# Patient Record
Sex: Male | Born: 1970 | Race: White | Hispanic: No | Marital: Married | Smoking: Former smoker
Health system: Southern US, Community
[De-identification: ages and names within clinical notes are randomized; demographics above are authoritative.]

## PROBLEM LIST (undated history)

## (undated) DIAGNOSIS — I10 Essential (primary) hypertension: Secondary | ICD-10-CM

## (undated) DIAGNOSIS — I350 Nonrheumatic aortic (valve) stenosis: Secondary | ICD-10-CM

## (undated) DIAGNOSIS — M1990 Unspecified osteoarthritis, unspecified site: Secondary | ICD-10-CM

## (undated) HISTORY — PX: CARDIAC SURGERY: SHX584

## (undated) HISTORY — PX: CHOLECYSTECTOMY: SHX55

## (undated) HISTORY — PX: SHOULDER SURGERY: SHX246

---
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
## 2001-10-08 ENCOUNTER — Ambulatory Visit (HOSPITAL_COMMUNITY): Admission: RE | Admit: 2001-10-08 | Discharge: 2001-10-08 | Payer: Self-pay | Admitting: Internal Medicine

## 2002-02-17 ENCOUNTER — Ambulatory Visit (HOSPITAL_COMMUNITY): Admission: RE | Admit: 2002-02-17 | Discharge: 2002-02-17 | Payer: Self-pay | Admitting: Internal Medicine

## 2002-05-08 ENCOUNTER — Encounter (INDEPENDENT_AMBULATORY_CARE_PROVIDER_SITE_OTHER): Payer: Self-pay | Admitting: Internal Medicine

## 2002-05-08 ENCOUNTER — Ambulatory Visit (HOSPITAL_COMMUNITY): Admission: RE | Admit: 2002-05-08 | Discharge: 2002-05-08 | Payer: Self-pay | Admitting: Internal Medicine

## 2002-06-23 ENCOUNTER — Ambulatory Visit (HOSPITAL_COMMUNITY): Admission: RE | Admit: 2002-06-23 | Discharge: 2002-06-23 | Payer: Self-pay | Admitting: Internal Medicine

## 2002-07-09 ENCOUNTER — Encounter (INDEPENDENT_AMBULATORY_CARE_PROVIDER_SITE_OTHER): Payer: Self-pay | Admitting: Internal Medicine

## 2002-07-09 ENCOUNTER — Ambulatory Visit (HOSPITAL_COMMUNITY): Admission: RE | Admit: 2002-07-09 | Discharge: 2002-07-09 | Payer: Self-pay | Admitting: Internal Medicine

## 2002-07-10 ENCOUNTER — Emergency Department (HOSPITAL_COMMUNITY): Admission: EM | Admit: 2002-07-10 | Discharge: 2002-07-10 | Payer: Self-pay | Admitting: Emergency Medicine

## 2002-07-17 ENCOUNTER — Observation Stay (HOSPITAL_COMMUNITY): Admission: RE | Admit: 2002-07-17 | Discharge: 2002-07-18 | Payer: Self-pay | Admitting: General Surgery

## 2003-06-19 ENCOUNTER — Emergency Department (HOSPITAL_COMMUNITY): Admission: EM | Admit: 2003-06-19 | Discharge: 2003-06-19 | Payer: Self-pay | Admitting: Emergency Medicine

## 2004-04-13 ENCOUNTER — Ambulatory Visit: Payer: Self-pay | Admitting: Family Medicine

## 2004-05-02 ENCOUNTER — Ambulatory Visit: Payer: Self-pay | Admitting: Family Medicine

## 2004-07-14 ENCOUNTER — Ambulatory Visit: Payer: Self-pay | Admitting: Family Medicine

## 2005-06-04 HISTORY — PX: SHOULDER ARTHROSCOPY: SHX128

## 2006-06-04 HISTORY — PX: CHOLECYSTECTOMY: SHX55

## 2007-02-24 ENCOUNTER — Ambulatory Visit (HOSPITAL_COMMUNITY): Admission: RE | Admit: 2007-02-24 | Discharge: 2007-02-24 | Payer: Self-pay | Admitting: Internal Medicine

## 2009-07-05 ENCOUNTER — Ambulatory Visit: Payer: Self-pay | Admitting: Cardiology

## 2010-10-20 NOTE — Op Note (Signed)
NAME:  Kenneth [name], Kenneth [name]                          ACCOUNT NO.:  [id number]   MEDICAL RECORD NO.:  [id number]                   PATIENT TYPE:  OBV   LOCATION:  A337                                 FACILITY:  [facility]   PHYSICIAN:  Dalia Heading, M.D.               DATE OF BIRTH:  09-04-1970   DATE OF PROCEDURE:  07/17/2002  DATE OF DISCHARGE:                                 OPERATIVE REPORT   PREOPERATIVE DIAGNOSIS:  Cholecystitis, cholelithiasis.   POSTOPERATIVE DIAGNOSIS:  Cholecystitis, cholelithiasis.   PROCEDURE:  Laparoscopic cholecystectomy.   SURGEON:  Dalia Heading, M.D.   ANESTHESIA:  General endotracheal   INDICATIONS:  The patient is a 40 year old white male who presents with  biliary colic secondary to cholecystitis and cholelithiasis.  The risks and  benefits of the procedure including bleeding, infection, hepatobiliary  injury, and the possibility of an open procedure were fully explained to the  patient, who gave informed consent.   DESCRIPTION OF PROCEDURE:  The patient was placed in the supine position.  After induction of general endotracheal anesthesia, the abdomen was prepped  and draped using the usual sterile technique with Betadine.  Surgical site  confirmation was performed.   A supraumbilical incision was made down to the fascia.  A Veress needle was  introduced into the abdominal cavity under direct visualization without  difficulty.  The patient was placed in reverse Trendelenburg position and an  additional 11-mm trocar was placed in the epigastric region and 5-mm trocars  were placed in the right upper quadrant and right flank regions.  The liver  was inspected and noted to be within normal limits.   The gallbladder was retracted superiorly and laterally.  The dissection was  begun around the infundibulum of the gallbladder.  The cystic duct was first  identified.  Its junction to the infundibulum fully identified.  Endoclips  were placed  proximally and distally on the cystic duct; and the cystic duct  was divided.  This was likewise done on the cystic artery.  The gallbladder  was then freed away from the gallbladder fossa using Bovie electrocautery.  The gallbladder was delivered through the epigastric trocar site without  difficulty.  The gallbladder fossa was inspected and no abnormal bleeding or  bile leakage was noted.  Surgicel was placed in the gallbladder fossa, the  subhepatic space, as well as the right hepatic gutter were irrigated with  normal saline.  All fluid and air were then evacuated from the abdominal  cavity prior to removal of the trocars.   All wounds were irrigated with normal saline.  All wounds were injected with  0.5% Sensorcaine.  The supraumbilical fascia was reapproximated using an #0  Vicryl interrupted suture. All skin incisions were closed using staples.  Betadine ointment and dry sterile dressings were applied.   All tape and needle counts correct at the end of the procedure.  The patient  was extubated in the operating room and went back to recovery room in awake  and stable condition.   COMPLICATIONS:  None.   SPECIMENS:  Gallbladder.   BLOOD LOSS:  Minimal.                                               Dalia Heading, M.D.    MAJ/MEDQ  D:  07/17/2002  T:  07/17/2002  Job:  [id number]   cc:   Lionel December, M.D.  [address]  [place]  [place] [address]  Fax: [phone]

## 2010-10-20 NOTE — Op Note (Signed)
   NAME:  Kenneth [name], Kenneth [name]                          ACCOUNT NO.:  [id number]   MEDICAL RECORD NO.:  [id number]                   PATIENT TYPE:  AMB   LOCATION:  DAY                                  FACILITY:  [facility]   PHYSICIAN:  Lionel December, M.D.                 DATE OF BIRTH:  Mar 04, 1971   DATE OF PROCEDURE:  06/23/2002  DATE OF DISCHARGE:                                 OPERATIVE REPORT   PROCEDURE:  Endoscopic retrograde cholangiopancreatography which could no be  completed.   INDICATIONS FOR PROCEDURE:  Romero is a 40 year old Caucasian male with  recurrent epigastric right upper quadrant pain. He had EUS at [facility] last  weekend noted to have tiny stones in his bile duct. He is therefore  returning for ERCP with endoscopic sphincterotomy. The procedure was  reviewed with the patient and informed consent was obtained.   PREOP MEDICATIONS:  The following medications were used for the procedure:  Cetacaine spray for pharyngeal topical anesthesia, Demerol 50 mg IV, Versed  12 mg IV in divided dose. He was also premedicated with Phenergan 12.5 mg  IV.   FINDINGS:  The procedure performed in radiology department. __________  fluoroscopy but it was not used. The patient's vital signs and O2  saturations were monitored during the procedure and remained stable. The  patient was placed in semiprone position and the therapeutic Olympus video  duodenoscope was passed via the oropharynx into the esophagus and stomach.  At this point, the patient turned his head to the other side and tried to  sit up. The endoscope was therefore withdrawn. The patient was given more  Versed. He just would not be still. I talked with our anesthesiologist, Dr.  Jayme Cloud. He felt that with conscious sedation on board it would not be a  good idea to give him anesthesia and it was decided to bring him back  another day.   FINAL DIAGNOSES:  1. Microlithiasis of the common bile duct.  2. Unsuccessful ERCP because  this patient could not tolerate conscious     sedation.   PLAN:  Will bring him back for ERCP under general anesthesia or propofol,  but he will be scheduled in OR.                                               Lionel December, M.D.    NR/MEDQ  D:  06/23/2002  T:  06/23/2002  Job:  [id number]   cc:   Colon Flattery  [address]  [place]  [place] [address]  Fax: [phone]

## 2010-10-20 NOTE — Op Note (Signed)
NAME:  Kenneth [name], Kenneth [name]                          ACCOUNT NO.:  [id number]   MEDICAL RECORD NO.:  [id number]                   PATIENT TYPE:  AMB   LOCATION:  DAY                                  FACILITY:  [facility]   PHYSICIAN:  Lionel December, M.D.                 DATE OF BIRTH:  03/24/1971   DATE OF PROCEDURE:  07/09/2002  DATE OF DISCHARGE:                                 OPERATIVE REPORT   PROCEDURE:  Endoscopic retrograde cholangiopancreatography with  sphincterotomy.   ENDOSCOPIST:  Lionel December, M.D.   INDICATIONS:  The patient is a 40 year old Caucasian male with recurrent  epigastric pain whose extensive workup was negative other than findings of  peptic ulcer disease, but he did not get better with therapy.  He had  endoscopic ultrasound at [facility] recently, which showed tiny stones in the  bile duct.  It is therefore felt that he may be having biliary colic.  He is  therefore undergoing this intervention.  The procedure is reviewed with the  patient and informed consent is obtained.  The patient was here two weeks  ago and could not be well sedated with conscious sedation.  He is therefore  returning to have this examined under anesthesia.  Procedure risks were  explained to the patient, and informed consent was obtained.   PREOPERATIVE MEDICATIONS:  He was given __________ 0.5 mg IV during the  procedure, and he was given general endotracheal anesthesia for the  procedure.   DESCRIPTION OF PROCEDURE:  The procedure was performed in the OR.  The  patient's vital signs and O2 saturations were monitored during the procedure  and remained stable.  He was placed in almost semi-prone position.  Therapeutic Olympus video endoscope was passed through the lateral pharynx  and esophagus, stomach, and across the pylorus into the bulb and descending  duodenum.  The ampulla of Vater was identified.  There was a small very  orifice which was inconspicuous.  Intramural segment was very  short.  Cannulation was attempted with __________ and a Jagwire.  The pancreatic  duct was initially cannulated and gently filled to the entire length.  There  were no filling defects.  The bile duct was then selectively cannulated.  There were no obvious filling defects.  The cystic duct was patent.  Leaving  the Jagwire in place, a sphincterotomy was performed.  It was a short  sphincterotomy, but there was good flow of contrast and bile duct in the  duodenum.  By the time the procedure was completed, all of the contrast had  drained into the duodenum.  The endoscope was withdrawn.  The patient  tolerated the procedure well.   FINAL DIAGNOSES:  1. Tiny ampulla with short intramural segment.  2. Normal pancreatogram.  3. Normal cholangiogram.  4. Sphincterotomy performed with excellent drainage.    PLAN:  The patient will be monitored in the  hospital prior to going home  later today.  We will plan to see him in the office in four weeks.  Need for  a cholecystectomy will be discussed with the patient on that visit.                                               Lionel December, M.D.    NR/MEDQ  D:  07/09/2002  T:  07/09/2002  Job:  [id number]   cc:   Colon Flattery  [address]  [place]  [place] [address]  Fax: [phone]

## 2010-10-20 NOTE — H&P (Signed)
NAME:  Kenneth [name], Kenneth [name] NO.:  [id number]   MEDICAL RECORD NO.:  [id number]                   PATIENT TYPE:   LOCATION:                                       FACILITY:   PHYSICIAN:  Dalia Heading, M.D.               DATE OF BIRTH:  [birth year]   DATE OF ADMISSION:  07/17/2002  DATE OF DISCHARGE:                                HISTORY & PHYSICAL   CHIEF COMPLAINT:  Cholecystitis and cholelithiasis.   HISTORY OF PRESENT ILLNESS:  The patient is a 40 year old white male who  recently underwent an ERCP with sphincterotomy on July 09, 2002, by  Lionel December, M.D., and was found to have microlithiasis.  He did have a  sphincterotomy at the time of his ERCP.  He is now referred for a  cholecystectomy.  He has been having intermittent episodes of right upper  quadrant abdominal pain with radiation to the right flank and back, nausea,  indigestion, and bloating for some time now.  No jaundice has been noted.  The symptoms seem to be worsening.   PAST MEDICAL HISTORY:  For the most part unremarkable.  Denies any bleeding  disorders.   PAST SURGICAL HISTORY:  As noted above and aortic valvuloplasty as a child.   CURRENT MEDICATIONS:  1. Hydrocodone.  2. An antibiotic for a cold, which he is finishing.   SOCIAL HISTORY:  He does smoke a packs of cigarettes a day.  He denies any  significant alcohol use.   PHYSICAL EXAMINATION:  GENERAL APPEARANCE:  The patient is a well-developed,  well-nourished, white male in no acute distress.  VITAL SIGNS:  He is afebrile and vital signs are stable.  HEENT:  No scleral icterus.  LUNGS:  Clear to auscultation with equal breath sounds bilaterally.  HEART:  Regular rate and rhythm without S3, S4, or murmurs.  ABDOMEN:  Soft with slight tenderness in the right upper quadrant to  palpation.  No hepatosplenomegaly, masses, or hernias are identified.   IMPRESSION:  1. Cholecystitis.  2. Cholelithiasis.    PLAN:   The patient is scheduled for laparoscopic cholecystectomy on July 17, 2002.  The risks and benefits of the procedure, including bleeding,  infection, hepatobiliary injury, and the possibility of an open procedure  were fully explained to the patient, who gave informed consent.                                               Dalia Heading, M.D.    MAJ/MEDQ  D:  07/16/2002  T:  07/16/2002  Job:  [id number]   cc:   Lionel December, M.D.  [address]  [place]  [place] [address]  Fax: [phone]   Kenneth [name]  [address]  [place]  [place] [address]  Fax: [phone]

## 2010-10-20 NOTE — Op Note (Signed)
NAME:  Kenneth [name], Kenneth [name]                          ACCOUNT NO.:  [id number]   MEDICAL RECORD NO.:  [id number]                   PATIENT TYPE:  AMB   LOCATION:  DAY                                  FACILITY:  [facility]   PHYSICIAN:  Lionel December, M.D.                 DATE OF BIRTH:  05-05-1971   DATE OF PROCEDURE:  02/17/2002  DATE OF DISCHARGE:                                 OPERATIVE REPORT   PROCEDURE:  Esophagogastroduodenoscopy.   ENDOSCOPIST:  Lionel December, M.D.   INDICATIONS:  This patient is a 40 year old Caucasian male with recurrent  upper abdominal pain.  Initially he presented in February of this year and  discharged with the diagnosis of pancreatitis.  However, this could not be  confirmed on a CT.  His pain did not respond to acid suppression.  His H.  pylori was negative.  He had an EGD in May this year which showed erosive  antral gastritis and 2 bulbar ulcers as well as changes of duodenitis.  I  was concerned about Crohn's disease, but no granulomas were seen on the  biopsy.  He was treated with acid suppression and given Prevpac in spite of  negative H. pylori given endoscopic findings.  He was seen in the office  about 10 weeks ago and he was doing great.  The pain started about a week  ago and has been getting gradually worse.  He is undergoing repeat EGD to  find out if his peptic ulcer disease is back or if he has a gastroduodenal  or Crohn's disease.  The procedure and risks were reviewed with the patient  and informed consent was obtained.  He has a history of aortic stenosis and  therefore received SB prophylaxis with ampicillin and gentamicin.   PREOPERATIVE MEDICATIONS:  Cetacaine spray for pharyngeal topical  anesthesia, Demerol 75 mg IV and Versed 12 mg IV in divided dose.   INSTRUMENT:  Olympus video system.   FINDINGS:  Procedure performed in endoscopy suite.  The patient's vital  signs and O2 saturation were monitored during the procedure and  remained  stable.  The patient was placed in the left lateral recumbent position and  endoscope was passed via the oropharynx without any difficulty into the  esophagus.   ESOPHAGUS:  Mucosa of the esophagus was normal throughout.  Squamocolumnar  junction was unremarkable.   STOMACH:  It was empty and distended very well with insufflation.  The  mucosa was normal except at the antrum there was intense prepyloric erythema  with a few erosions, but no ulcer was noted.  Pyloric channel was patent.  Angularis and fundus were examined by retroflexing the scope and were  normal.   DUODENUM:  Examination of the bulb revealed multiple patches of erythematous  mucosa with erosions and a small ulcer towards the anteromedial wall.  The  scope was passed to the second part  of the duodenum where the mucosa and  folds were normal.  Multiple pictures were taken.  Biopsies were also taken  from the duodenal ulcer and surrounding area.   The endoscope was withdrawn.  The patient tolerated the procedure well.    FINAL DIAGNOSES:  1. Small bulbar ulcer with previous changes of duodenitis.  2. Antral gastritis.  Biopsy taken from the duodenal bulb.   RECOMMENDATIONS:  1. We will start him back on Nexium at 40 mg p.o. q.a.m.  2. H. pylori serology will be rechecked today along with serum amylase and     lipase.  He will continue to use Vicodin on a p.r.n. basis.  Prescription     was called in for 30 on 02/14/02.  3. I will be contacting the patient with biopsy results and further     recommendations.                                               Lionel December, M.D.    NR/MEDQ  D:  02/17/2002  T:  02/18/2002  Job:  [id number]   cc:   Colon Flattery  [address]  [place]  [place] [address]  Fax: [phone]

## 2013-09-27 ENCOUNTER — Emergency Department (HOSPITAL_COMMUNITY)
Admission: EM | Admit: 2013-09-27 | Discharge: 2013-09-27 | Disposition: A | Discharge status: Home / Self Care | Payer: PRIVATE HEALTH INSURANCE | Attending: Emergency Medicine | Admitting: Emergency Medicine

## 2013-09-27 ENCOUNTER — Emergency Department (HOSPITAL_COMMUNITY): Payer: PRIVATE HEALTH INSURANCE

## 2013-09-27 ENCOUNTER — Encounter (HOSPITAL_COMMUNITY): Payer: Self-pay | Admitting: Emergency Medicine

## 2013-09-27 DIAGNOSIS — S60219A Contusion of unspecified wrist, initial encounter: Secondary | ICD-10-CM | POA: Insufficient documentation

## 2013-09-27 DIAGNOSIS — Y9301 Activity, walking, marching and hiking: Secondary | ICD-10-CM | POA: Insufficient documentation

## 2013-09-27 DIAGNOSIS — S60211A Contusion of right wrist, initial encounter: Secondary | ICD-10-CM

## 2013-09-27 DIAGNOSIS — R296 Repeated falls: Secondary | ICD-10-CM | POA: Insufficient documentation

## 2013-09-27 DIAGNOSIS — Z87891 Personal history of nicotine dependence: Secondary | ICD-10-CM | POA: Insufficient documentation

## 2013-09-27 DIAGNOSIS — I10 Essential (primary) hypertension: Secondary | ICD-10-CM | POA: Insufficient documentation

## 2013-09-27 DIAGNOSIS — Y9289 Other specified places as the place of occurrence of the external cause: Secondary | ICD-10-CM | POA: Insufficient documentation

## 2013-09-27 HISTORY — DX: Essential (primary) hypertension: I10

## 2013-09-27 HISTORY — DX: Nonrheumatic aortic (valve) stenosis: I35.0

## 2013-09-27 MED ORDER — IBUPROFEN 800 MG PO TABS
800.0000 mg | ORAL_TABLET | Freq: Once | ORAL | Status: AC
  Administered 2013-09-27: 800 mg via ORAL
  Filled 2013-09-27: qty 1

## 2013-09-27 MED ORDER — HYDROCODONE-ACETAMINOPHEN 5-325 MG PO TABS: 2.0000 | ORAL_TABLET | ORAL | Status: DC | PRN

## 2013-09-27 MED ORDER — HYDROCODONE-ACETAMINOPHEN 5-325 MG PO TABS: 1.0000 | ORAL_TABLET | ORAL | Status: DC | PRN

## 2013-09-27 MED ORDER — ACETAMINOPHEN 500 MG PO TABS
1000.0000 mg | ORAL_TABLET | Freq: Once | ORAL | Status: AC
  Administered 2013-09-27: 1000 mg via ORAL
  Filled 2013-09-27: qty 2

## 2013-09-27 MED ORDER — DICLOFENAC SODIUM 75 MG PO TBEC: 75.0000 mg | DELAYED_RELEASE_TABLET | Freq: Two times a day (BID) | ORAL | Status: DC

## 2013-09-27 NOTE — ED Notes (Signed)
Pt reports walking on the river bank which went out from under him. Pt fell catching himself on right wrist on a rock. No lacerations noted. C/o pain to ulnar aspect of wrist and tingling into 5th finger. Pt has sensation and positive pulse in extremity.

## 2013-09-27 NOTE — Discharge Instructions (Signed)
Please leave the splint in place over the next 5 days. Please apply ice to your wrist. Please use the sling. Use diclofenac 2 times daily with food. May use Norco for pain if needed. Norco may cause drowsiness, please use with caution. Please see Dr. Hilda LiasKeeling, or the orthopedic specialist of your choice if pain is not improving when you take the splint off in 5 days. Contusion A contusion is a deep bruise. Contusions are the result of an injury that caused bleeding under the skin. The contusion may turn blue, purple, or yellow. Minor injuries will give you a painless contusion, but more severe contusions may stay painful and swollen for a few weeks.  CAUSES  A contusion is usually caused by a blow, trauma, or direct force to an area of the body. SYMPTOMS   Swelling and redness of the injured area.  Bruising of the injured area.  Tenderness and soreness of the injured area.  Pain. DIAGNOSIS  The diagnosis can be made by taking a history and physical exam. An X-ray, CT scan, or MRI may be needed to determine if there were any associated injuries, such as fractures. TREATMENT  Specific treatment will depend on what area of the body was injured. In general, the best treatment for a contusion is resting, icing, elevating, and applying cold compresses to the injured area. Over-the-counter medicines may also be recommended for pain control. Ask your caregiver what the best treatment is for your contusion. HOME CARE INSTRUCTIONS   Put ice on the injured area.  Put ice in a plastic bag.  Place a towel between your skin and the bag.  Leave the ice on for 15-20 minutes, 03-04 times a day.  Only take over-the-counter or prescription medicines for pain, discomfort, or fever as directed by your caregiver. Your caregiver may recommend avoiding anti-inflammatory medicines (aspirin, ibuprofen, and naproxen) for 48 hours because these medicines may increase bruising.  Rest the injured area.  If  possible, elevate the injured area to reduce swelling. SEEK IMMEDIATE MEDICAL CARE IF:   You have increased bruising or swelling.  You have pain that is getting worse.  Your swelling or pain is not relieved with medicines. MAKE SURE YOU:   Understand these instructions.  Will watch your condition.  Will get help right away if you are not doing well or get worse. Document Released: 02/28/2005 Document Revised: 08/13/2011 Document Reviewed: 03/26/2011 [facility] Patient Information 2014 [place], [place].

## 2013-09-27 NOTE — ED Provider Notes (Signed)
CSN: [id number]     Arrival date & time 09/27/13  1935 History   First MD Initiated Contact with Patient 09/27/13 2017     Chief Complaint  Patient presents with  . Wrist Pain     (Consider location/radiation/quality/duration/timing/severity/associated sxs/prior Treatment) Patient is a 43 y.o. male presenting with wrist pain. The history is provided by the patient.  Wrist Pain This is a new problem. The current episode started today. The problem occurs constantly. The problem has been gradually worsening. Associated symptoms include numbness. Pertinent negatives include no abdominal pain, arthralgias, chest pain, coughing, neck pain or weakness. He has tried nothing for the symptoms. The treatment provided no relief.    Past Medical History  Diagnosis Date  . Hypertension   . Aortic stenosis    Past Surgical History  Procedure Laterality Date  . Cardiac surgery    . Shoulder surgery    . Cholecystectomy     No family history on file. History  Substance Use Topics  . Smoking status: Former Games developermoker  . Smokeless tobacco: Not on file  . Alcohol Use: No    Review of Systems  Constitutional: Negative for activity change.       All ROS Neg except as noted in HPI  HENT: Negative for nosebleeds.   Eyes: Negative for photophobia and discharge.  Respiratory: Negative for cough, shortness of breath and wheezing.   Cardiovascular: Negative for chest pain and palpitations.  Gastrointestinal: Negative for abdominal pain and blood in stool.  Genitourinary: Negative for dysuria, frequency and hematuria.  Musculoskeletal: Negative for arthralgias, back pain and neck pain.  Skin: Negative.   Neurological: Positive for numbness. Negative for dizziness, seizures, speech difficulty and weakness.  Psychiatric/Behavioral: Negative for hallucinations and confusion.      Allergies  Review of patient's allergies indicates no known allergies.  Home Medications   Prior to Admission  medications   Not on File   BP 133/81  Pulse 92  Temp(Src) 98 F (36.7 C) (Oral)  Resp 20  Ht 5\' 7"  (1.702 m)  Wt 200 lb (90.719 kg)  BMI 31.32 kg/m2  SpO2 98% Physical Exam  Nursing note and vitals reviewed. Constitutional: He is oriented to person, place, and time. He appears well-developed and well-nourished.  Non-toxic appearance.  HENT:  Head: Normocephalic.  Right Ear: Tympanic membrane and external ear normal.  Left Ear: Tympanic membrane and external ear normal.  Eyes: EOM and lids are normal. Pupils are equal, round, and reactive to light.  Neck: Normal range of motion. Neck supple. Carotid bruit is not present.  Cardiovascular: Normal rate, regular rhythm, normal heart sounds, intact distal pulses and normal pulses.   Pulmonary/Chest: Breath sounds normal. No respiratory distress.  Abdominal: Soft. Bowel sounds are normal. There is no tenderness. There is no guarding.  Musculoskeletal:       Right wrist: He exhibits decreased range of motion, tenderness and bony tenderness. He exhibits no effusion and no deformity.  Pain with movement of the right 5th finger. No noted deformity.  Lymphadenopathy:       Head (right side): No submandibular adenopathy present.       Head (left side): No submandibular adenopathy present.    He has no cervical adenopathy.  Neurological: He is alert and oriented to person, place, and time. He has normal strength. No cranial nerve deficit or sensory deficit.  Skin: Skin is warm and dry.  Psychiatric: He has a normal mood and affect. His speech is normal.  ED Course  Procedures (including critical care time) Labs Review Labs Reviewed - No data to display  Imaging Review Dg Wrist Complete Right  09/27/2013   CLINICAL DATA:  Status post fall. Right wrist pain on the ulnar side.  EXAM: RIGHT WRIST - COMPLETE 3+ VIEW  COMPARISON:  None.  FINDINGS: Imaged bones, joints and soft tissues appear normal.  IMPRESSION: Negative exam.    Electronically Signed   By: Drusilla Kannerhomas  Dalessio M.D.   On: 09/27/2013 20:19     EKG Interpretation None      MDM Xray of the right wrist is negative for fx. Pt extremely tender to palpation and movement of the wrist and the right fifth finger.  Pt to be placed in an ulnar gutter splint and sling. Rx for diclofenac and norco given to the pt. He will follow up with Dr Hilda LiasKeeling in 5 to 7 day if pain continues.   Final diagnoses:  None    *I have reviewed nursing notes, vital signs, and all appropriate lab and imaging results for this patient.Kathie Dike**    Dlisa Barnwell M Rosanne Wohlfarth, PA-C 09/28/13 0021

## 2013-09-28 MED FILL — Hydrocodone-Acetaminophen Tab 5-325 MG: ORAL | Qty: 6 | Status: AC

## 2013-09-28 NOTE — ED Provider Notes (Signed)
Medical screening examination/treatment/procedure(s) were performed by non-physician practitioner and as supervising physician I was immediately available for consultation/collaboration.   EKG Interpretation None        Kenneth LennertJoseph L Darron Stuck, MD 09/28/13 [phone]

## 2016-07-30 ENCOUNTER — Other Ambulatory Visit: Payer: Self-pay | Admitting: Physician Assistant

## 2016-07-30 NOTE — Telephone Encounter (Signed)
[facility]  ??

## 2016-09-12 ENCOUNTER — Ambulatory Visit: Payer: Self-pay | Admitting: Physician Assistant

## 2016-09-13 ENCOUNTER — Encounter: Payer: Self-pay | Admitting: Physician Assistant

## 2016-10-02 ENCOUNTER — Other Ambulatory Visit: Payer: Self-pay | Admitting: Physician Assistant

## 2016-10-02 NOTE — Telephone Encounter (Signed)
No visit at [facility] yet - jones pt?

## 2016-10-05 ENCOUNTER — Other Ambulatory Visit: Payer: Self-pay | Admitting: Physician Assistant

## 2016-10-05 NOTE — Telephone Encounter (Signed)
Hasn't been seen here in this office?

## 2017-04-24 ENCOUNTER — Encounter (HOSPITAL_COMMUNITY): Payer: Self-pay | Admitting: *Deleted

## 2017-04-24 ENCOUNTER — Emergency Department (HOSPITAL_COMMUNITY)
Admission: EM | Admit: 2017-04-24 | Discharge: 2017-04-24 | Disposition: A | Discharge status: Home / Self Care | Payer: PRIVATE HEALTH INSURANCE | Attending: Emergency Medicine | Admitting: Emergency Medicine

## 2017-04-24 ENCOUNTER — Other Ambulatory Visit: Payer: Self-pay

## 2017-04-24 DIAGNOSIS — R2242 Localized swelling, mass and lump, left lower limb: Secondary | ICD-10-CM | POA: Diagnosis present

## 2017-04-24 DIAGNOSIS — I10 Essential (primary) hypertension: Secondary | ICD-10-CM | POA: Insufficient documentation

## 2017-04-24 DIAGNOSIS — Z87891 Personal history of nicotine dependence: Secondary | ICD-10-CM | POA: Diagnosis not present

## 2017-04-24 DIAGNOSIS — L02416 Cutaneous abscess of left lower limb: Secondary | ICD-10-CM | POA: Diagnosis not present

## 2017-04-24 DIAGNOSIS — Z79899 Other long term (current) drug therapy: Secondary | ICD-10-CM | POA: Insufficient documentation

## 2017-04-24 DIAGNOSIS — Z9049 Acquired absence of other specified parts of digestive tract: Secondary | ICD-10-CM | POA: Insufficient documentation

## 2017-04-24 MED ORDER — KETOROLAC TROMETHAMINE 30 MG/ML IJ SOLN
60.0000 mg | Freq: Once | INTRAMUSCULAR | Status: AC
  Administered 2017-04-24: 60 mg via INTRAMUSCULAR
  Filled 2017-04-24: qty 2

## 2017-04-24 MED ORDER — HYDROCODONE-ACETAMINOPHEN 5-325 MG PO TABS: 1.0000 | ORAL_TABLET | Freq: Four times a day (QID) | ORAL | 0 refills | Status: DC | PRN

## 2017-04-24 MED ORDER — LIDOCAINE-EPINEPHRINE (PF) 2 %-1:200000 IJ SOLN
INTRAMUSCULAR | Status: AC
  Filled 2017-04-24: qty 20

## 2017-04-24 MED ORDER — CLINDAMYCIN HCL 300 MG PO CAPS: 300.0000 mg | ORAL_CAPSULE | Freq: Four times a day (QID) | ORAL | 0 refills | Status: DC

## 2017-04-24 NOTE — Discharge Instructions (Signed)
Continue to soak in a tub of warm epsom salts for 30 minutes 3 times a day. Take the clindamycin antibiotic 4 times a day for 10 days, I gave you extra incase you don't have enough to do that. Return to the ED if you get a fever, vomiting, or the pain and swelling and redness aren't improving over the next 24-48 hrs. You can remove the packing while in the tub in 2 days, just pull it out quickly with your leg under water. It is less than an inch long. Take the hydrocodone for pain with ibuprofen 600 mg 4 times a day for pain.

## 2017-04-24 NOTE — ED Provider Notes (Signed)
[facility]GENCY [facility]PARTMENT Provider Note   CSN: [id number] Arrival date & time: 04/24/17  0515  Time seen 06:15 AM   History   Chief Complaint Chief Complaint  Patient presents with  . Abscess    HPI Kenneth [name] is a 46 y.o. male.  HPI patient reports he started having nasal congestion and facial fullness on November 17.  He was seen by his PCP on November 19 and diagnosed with a sinus infection and was started on clindamycin.  He took 1 dose that day.  The following day he started having some pain in his left posterior thigh and had some redness in the area.  He thought it looked like a bite area.  He went to urgent care and he was diagnosed with an abscess.  He was given an injection of Rocephin.  He states the area is getting more red and more painful.  Its he states it woke him up this morning.  Patient states he had an abscess before on his right lower leg that was MRSA.  He was hospitalized for about 3 days.  That was about 10 years ago.  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  Past Medical History:  Diagnosis Date  . Aortic stenosis   . Aortic stenosis   . Hypertension     There are no active problems to display for this patient.   Past Surgical History:  Procedure Laterality Date  . CARDIAC SURGERY    . CHOLECYSTECTOMY    . SHOULDER SURGERY         Home Medications    Clindamycin 300 mg TID   Prior to Admission medications   Medication Sig Start Date End Date Taking? Authorizing Provider  clindamycin (CLEOCIN) 300 MG capsule Take 1 capsule (300 mg total) by mouth 4 (four) times daily. 04/24/17   Devoria AlbeKnapp, Auriana Scalia, MD  cyclobenzaprine (FLEXERIL) 10 MG tablet Take 1 Tablet by mouth 2 times a day AS NEEDED FOR MUSCLE SPASMS 10/02/16   Remus LofflerJones, Angel S, PA-C  diclofenac (VOLTAREN) 75 MG EC tablet Take 1 tablet (75 mg total) by mouth 2 (two) times daily. 09/27/13   Ivery QualeBryant, Hobson, PA-C  enalapril (VASOTEC) 5 MG  tablet Take 1 Tablet by mouth once daily 10/05/16   Remus LofflerJones, Angel S, PA-C  HYDROcodone-acetaminophen (NORCO/VICODIN) 5-325 MG tablet Take 1 tablet by mouth every 6 (six) hours as needed. 04/24/17   Devoria AlbeKnapp, Mulki Roesler, MD    Family History No family history on file.  Social History Social History   Tobacco Use  . Smoking status: Former Games developermoker  . Smokeless tobacco: Never Used  Substance Use Topics  . Alcohol use: No  . Drug use: No  employed   Allergies   Patient has no known allergies.   Review of Systems Review of Systems  All other systems reviewed and are negative.    Physical Exam Updated Vital Signs BP (!) 144/95   Pulse 75   Temp 98.1 F (36.7 C)   Resp 18   Ht 5\' 6"  (1.676 m)   Wt 102.1 kg (225 lb)   SpO2 98%   BMI 36.32 kg/m   Vital signs normal    Physical Exam  Constitutional: He is oriented to person, place, and time. He appears well-developed and well-nourished. He appears distressed.  HENT:  Head: Normocephalic and atraumatic.  Right Ear: External ear normal.  Left Ear: External ear normal.  Nose: Nose normal.  Eyes: Conjunctivae and EOM are normal.  Neck: Normal range of motion.  Cardiovascular: Normal rate.  Pulmonary/Chest: Effort normal. No respiratory distress.  Musculoskeletal: Normal range of motion. He exhibits edema and tenderness.  Neurological: He is alert and oriented to person, place, and time. No cranial nerve deficit.  Skin: Skin is warm and dry. There is erythema.  Patient has a 6 cm area of redness and swelling with induration in the center on his left posterior medial proximal thigh.  There is a small area in the center that is consistent with a bite mark.  The area is very painful to palpation with induration.  Nursing note and vitals reviewed.    ED Treatments / Results  Labs (all labs ordered are listed, but only abnormal results are displayed) Labs Reviewed - No data to display  EKG  EKG Interpretation None        Radiology No results found.  Procedures .Marland KitchenIncision and Drainage Date/Time: 04/24/2017 6:37 AM Performed by: Devoria Albe, MD Authorized by: Devoria Albe, MD   Consent:    Consent obtained:  Verbal   Consent given by:  Patient   Risks discussed:  Infection   Alternatives discussed:  No treatment Location:    Type:  Abscess   Size:  6   Location:  Lower extremity   Lower extremity location:  Leg   Leg location:  L upper leg Pre-procedure details:    Skin preparation:  Betadine Anesthesia (see MAR for exact dosages):    Anesthesia method:  Local infiltration   Local anesthetic:  Lidocaine 2% WITH epi Procedure type:    Complexity:  Simple Procedure details:    Needle aspiration: no     Incision types:  Single straight   Incision depth:  Subcutaneous   Scalpel blade:  11   Wound management:  Probed and deloculated   Drainage:  Bloody and purulent   Drainage amount:  Scant   Packing materials:  1/2 in gauze   Amount 1/2":  2 Post-procedure details:    Patient tolerance of procedure:  Tolerated well, no immediate complications   (including critical care time)  Medications Ordered in ED Medications  lidocaine-EPINEPHrine (XYLOCAINE W/EPI) 2 %-1:200000 (PF) injection (not administered)  ketorolac (TORADOL) 30 MG/ML injection 60 mg (not administered)     Initial Impression / Assessment and Plan / ED Course  I have reviewed the triage vital signs and the nursing notes.  Pertinent labs & imaging results that were available during my care of the patient were reviewed by me and considered in my medical decision making (see chart for details).     Patient was given Toradol IM for pain. He has to drive himself home.  Patient is already on an antibiotic that should cover this infection.  He has only had however 4 doses of the antibiotics since it started.  The antibiotic was written for 3 times a day, I increased it to 4 times a day and gave him extra pills to make sure he  does not run out.  He needs to continue the antibiotic, he can continue doing the warm soaks with Epsom salts as he has been doing.  He was given Norco prepack for pain.  Review of the [place] shows no entries for the past year.  Final Clinical Impressions(s) / ED Diagnoses   Final diagnoses:  Abscess of left lower leg    ED Discharge Orders        Ordered  HYDROcodone-acetaminophen (NORCO/VICODIN) 5-325 MG tablet  Every 6 hours PRN     04/24/17 0648    clindamycin (CLEOCIN) 300 MG capsule  4 times daily     04/24/17 0651    OTC ibuprofen   Plan discharge  Devoria AlbeIva Zanaria Morell, MD, Concha PyoFACEP     Kaven Cumbie, MD 04/24/17 [phone]

## 2017-04-24 NOTE — ED Triage Notes (Signed)
Pt c/o abscess to back of left thigh area, was treated at urgent care yesterday, started on clindamycin, started running fever during the night,

## 2017-04-26 ENCOUNTER — Emergency Department (HOSPITAL_COMMUNITY)
Admission: EM | Admit: 2017-04-26 | Discharge: 2017-04-26 | Disposition: A | Discharge status: Home / Self Care | Payer: PRIVATE HEALTH INSURANCE | Attending: Emergency Medicine | Admitting: Emergency Medicine

## 2017-04-26 ENCOUNTER — Other Ambulatory Visit: Payer: Self-pay

## 2017-04-26 ENCOUNTER — Encounter (HOSPITAL_COMMUNITY): Payer: Self-pay | Admitting: Emergency Medicine

## 2017-04-26 DIAGNOSIS — Z79899 Other long term (current) drug therapy: Secondary | ICD-10-CM | POA: Diagnosis not present

## 2017-04-26 DIAGNOSIS — Z9049 Acquired absence of other specified parts of digestive tract: Secondary | ICD-10-CM | POA: Diagnosis not present

## 2017-04-26 DIAGNOSIS — I10 Essential (primary) hypertension: Secondary | ICD-10-CM | POA: Insufficient documentation

## 2017-04-26 DIAGNOSIS — Z87891 Personal history of nicotine dependence: Secondary | ICD-10-CM | POA: Diagnosis not present

## 2017-04-26 DIAGNOSIS — L03116 Cellulitis of left lower limb: Secondary | ICD-10-CM | POA: Diagnosis present

## 2017-04-26 DIAGNOSIS — L02416 Cutaneous abscess of left lower limb: Secondary | ICD-10-CM | POA: Insufficient documentation

## 2017-04-26 DIAGNOSIS — R2242 Localized swelling, mass and lump, left lower limb: Secondary | ICD-10-CM | POA: Diagnosis present

## 2017-04-26 LAB — CBC WITH DIFFERENTIAL/PLATELET
BASOS ABS: 0 10*3/uL (ref 0.0–0.1)
Basophils Relative: 1 %
EOS PCT: 4 %
Eosinophils Absolute: 0.3 10*3/uL (ref 0.0–0.7)
HCT: 45.7 % (ref 39.0–52.0)
Hemoglobin: 15.3 g/dL (ref 13.0–17.0)
Lymphocytes Relative: 30 %
Lymphs Abs: 2.5 10*3/uL (ref 0.7–4.0)
MCH: 30.1 pg (ref 26.0–34.0)
MCHC: 33.5 g/dL (ref 30.0–36.0)
MCV: 89.8 fL (ref 78.0–100.0)
Monocytes Absolute: 0.5 10*3/uL (ref 0.1–1.0)
Monocytes Relative: 6 %
NEUTROS ABS: 5.1 10*3/uL (ref 1.7–7.7)
Neutrophils Relative %: 59 %
Platelets: 271 10*3/uL (ref 150–400)
RBC: 5.09 MIL/uL (ref 4.22–5.81)
RDW: 12.4 % (ref 11.5–15.5)
WBC: 8.5 10*3/uL (ref 4.0–10.5)

## 2017-04-26 LAB — BASIC METABOLIC PANEL
ANION GAP: 9 (ref 5–15)
BUN: 11 mg/dL (ref 6–20)
CO2: 28 mmol/L (ref 22–32)
Calcium: 10.5 mg/dL — ABNORMAL HIGH (ref 8.9–10.3)
Chloride: 101 mmol/L (ref 101–111)
Creatinine, Ser: 1.11 mg/dL (ref 0.61–1.24)
GFR calc non Af Amer: >60 mL/min (ref >60)
GLUCOSE: 92 mg/dL (ref 65–99)
Potassium: 3.9 mmol/L (ref 3.5–5.1)
Sodium: 138 mmol/L (ref 135–145)

## 2017-04-26 MED ORDER — VANCOMYCIN HCL IN DEXTROSE 1-5 GM/200ML-% IV SOLN
1000.0000 mg | Freq: Once | INTRAVENOUS | Status: AC
  Administered 2017-04-26: 1000 mg via INTRAVENOUS
  Filled 2017-04-26: qty 200

## 2017-04-26 MED ORDER — MORPHINE SULFATE (PF) 4 MG/ML IV SOLN
4.0000 mg | Freq: Once | INTRAVENOUS | Status: AC
  Administered 2017-04-26: 4 mg via INTRAVENOUS
  Filled 2017-04-26: qty 1

## 2017-04-26 MED ORDER — ONDANSETRON HCL 4 MG/2ML IJ SOLN
4.0000 mg | Freq: Once | INTRAMUSCULAR | Status: AC
  Administered 2017-04-26: 4 mg via INTRAVENOUS
  Filled 2017-04-26: qty 2

## 2017-04-26 MED ORDER — CEPHALEXIN 500 MG PO CAPS: 500.0000 mg | ORAL_CAPSULE | Freq: Three times a day (TID) | ORAL | 0 refills | Status: AC

## 2017-04-26 MED ORDER — SULFAMETHOXAZOLE-TRIMETHOPRIM 800-160 MG PO TABS: 2.0000 | ORAL_TABLET | Freq: Two times a day (BID) | ORAL | 0 refills | Status: AC

## 2017-04-26 NOTE — Consult Note (Signed)
Patient Demographics:    Kenneth [name], is a 46 y.o. male  MRN: [id number]   DOB - 18-Feb-1971  Admit Date - 04/26/2017  Outpatient Primary MD for the patient is Samuel Jester, DO   Assessment & Plan:    Principal Problem:   Cellulitis of left Posterior thigh   1)Lt Posterior Thigh Cellulitis-  ?? Infected Insect bite (since 04/22/17) in a pt with prior h/o MRSA skin infection,  failed oral Clindamycin,  received IM Rocephin on 04/23/2017 at Urgent care clinic, no fevers, no chills, no body aches/myalgias, no Leukocytosis. Wound cx sent on 04/26/17, iv Vancomycin 2 gm x 1 given in ED on 04/26/17 , okay to discharge home on Bactrim double strength 2 tablets twice daily for 10 days along with Keflex 500 mg  3 times daily for 10 days.  Patient is an otherwise healthy male with no immunosuppression, diabetes or the significant comorbid conditions. Pt will call back on Monday 04/29/17 to discuss results of wound culture obtained on 04/26/2017 patient may call back sooner if fever, chills, or worsening redness swelling or pain of the left posterior thigh.  Local wound care as previously advised  2)H/o Aortic Stenosis-asymptomatic at this time   3)Disposition-patient was given the option of admission to the hospital for IV vancomycin therapy Versus discharge home after single dose of IV vancomycin, at this time patient would like to go home as outlined above #1, he will return if symptoms worsen.  Ms Idol (APP) present throughout my visit/conversation with patient.  Avoid excessive NSAID use  With History of - Reviewed by me  Past Medical History:  Diagnosis Date  . Aortic stenosis   . Aortic stenosis   . Hypertension       Past Surgical History:  Procedure Laterality Date  . CARDIAC SURGERY    . CHOLECYSTECTOMY      . SHOULDER SURGERY      Chief Complaint  Patient presents with  . Wound Check      HPI:    Kenneth [name]  is a 46 y.o. male who is a reformed smoker with past medical history relevant for aortic stenosis presents to the ED for follow-up of left upper thigh wound.  Patient had redness and swelling which pain over the left upper thigh area starting on 04/22/2017.  He was seen at urgent care on 04/23/2017, he was given IM Rocephin times 1 gram, he was advised to continue clindamycin which was previously prescribed by PCP on 04/22/2017 for sinus infection.  Patient was subsequently seen in the ED on 04/23/2017, I&D was done by wound culture was not sent, he was asked to change clindamycin to 4 times a day.   He returns to the ED today (04/26/17) due to persistent induration redness warmth tenderness over the left upper posterior thigh area.    No fevers, no chills, no body aches/myalgias, no Leukocytosis  In ED today wound cultures obtained and sent,  IV vancomycin 2 g x1 given, patient was given the option of admission versus going home , he would like to go home at this time   Patient has prior h/o MRSA skin infection, he  is an otherwise healthy male with no immunosuppression, diabetes or the significant comorbid conditions.   Review of systems:    In addition to the HPI above,   A full 12 point Review of 10 Systems was done, except as stated above, all other Review of 10 Systems were negative.   Social History:  Reviewed by me   Social History   Tobacco Use  . Smoking status: Former Games developermoker  . Smokeless tobacco: Never Used  Substance Use Topics  . Alcohol use: No     Family History :  Reviewed by me   History reviewed. No pertinent family history. H/o HTN   Home Medications:   Prior to Admission medications   Medication Sig Start Date End Date Taking? Authorizing Provider  cephALEXin (KEFLEX) 500 MG capsule Take 1 capsule (500 mg total) by mouth 3 (three) times daily  for 10 days. 04/26/17 05/06/17  Burgess AmorIdol, Julie, PA-C  cyclobenzaprine (FLEXERIL) 10 MG tablet Take 1 Tablet by mouth 2 times a day AS NEEDED FOR MUSCLE SPASMS 10/02/16   Remus LofflerJones, Angel S, PA-C  diclofenac (VOLTAREN) 75 MG EC tablet Take 1 tablet (75 mg total) by mouth 2 (two) times daily. 09/27/13   Ivery QualeBryant, Hobson, PA-C  enalapril (VASOTEC) 5 MG tablet Take 1 Tablet by mouth once daily 10/05/16   Remus LofflerJones, Angel S, PA-C  HYDROcodone-acetaminophen (NORCO/VICODIN) 5-325 MG tablet Take 1 tablet by mouth every 6 (six) hours as needed. 04/24/17   Devoria AlbeKnapp, Iva, MD  sulfamethoxazole-trimethoprim (BACTRIM DS,SEPTRA DS) 800-160 MG tablet Take 2 tablets by mouth 2 (two) times daily for 10 days. 04/26/17 05/06/17  Burgess AmorIdol, Julie, PA-C     Allergies:    No Known Allergies   Physical Exam:   Vitals  Blood pressure 111/90, pulse 75, temperature 98.7 F (37.1 C), temperature source Oral, resp. rate 18, SpO2 96 %.  Physical Examination: General appearance - alert, well appearing, and in no distress and  Mental status - alert, oriented to person, place, and time,  Eyes - sclera anicteric Neck - supple, no JVD elevation , Chest - clear  to auscultation bilaterally, symmetrical air movement,  Heart - S1 and S2 normal,  Abdomen - soft, nontender, nondistended, no masses or organomegaly Neurological - screening mental status exam normal, neck supple without rigidity, cranial nerves II through XII intact, DTR's normal and symmetric Extremities - no pedal edema noted, intact peripheral pulses  Skin - Tender, open I&D site left upper posterior thigh without significant drainage.  mild  Induration noted, Surrounding erythema measuring 18 x 12 cm, no red streaking.  No groin or scrotal involvement.   Area of erythema is marked with permanent marker, No fluctuance.   Data Review:    CBC Recent Labs  Lab 04/26/17 1634  WBC 8.5  HGB 15.3  HCT 45.7  PLT 271  MCV 89.8  MCH 30.1  MCHC 33.5  RDW 12.4  LYMPHSABS 2.5  MONOABS  0.5  EOSABS 0.3  BASOSABS 0.0   ------------------------------------------------------------------------------------------------------------------  Chemistries  Recent Labs  Lab 04/26/17 1634  NA 138  K 3.9  CL 101  CO2 28  GLUCOSE 92  BUN 11  CREATININE 1.11  CALCIUM 10.5*   ------------------------------------------------------------------------------------------------------------------ estimated creatinine clearance is 91.1 mL/min (by C-G formula based on SCr of 1.11  mg/dL). ------------------------------------------------------------------------------------------------------------------ No results for input(s): TSH, T4TOTAL, T3FREE, THYROIDAB in the last 72 hours.  Invalid input(s): FREET3   Coagulation profile No results for input(s): INR, PROTIME in the last 168 hours. ------------------------------------------------------------------------------------------------------------------- No results for input(s): DDIMER in the last 72 hours. -------------------------------------------------------------------------------------------------------------------  Cardiac Enzymes No results for input(s): CKMB, TROPONINI, MYOGLOBIN in the last 168 hours.  Invalid input(s): CK ------------------------------------------------------------------------------------------------------------------ No results found for: BNP   ---------------------------------------------------------------------------------------------------------------  Urinalysis No results found for: COLORURINE, APPEARANCEUR, LABSPEC, PHURINE, GLUCOSEU, HGBUR, BILIRUBINUR, KETONESUR, PROTEINUR, UROBILINOGEN, NITRITE, LEUKOCYTESUR  ----------------------------------------------------------------------------------------------------------------   Imaging Results:    No results found.  Radiological Exams on Admission: No results found.  Family Communication: Admission, patients condition and plan of care including  tests being ordered have been discussed with the patient and Ms Idol (APP) who indicate understanding and agree with the plan   Code Status - Full Code  Likely DC to  Home   Condition   stable  Shon Haleourage Dellis Voght M.D on 04/26/2017 at 7:46 PM   Between 7am to 7pm - Pager - [phone] After 7pm go to [email or web address] - password [email or web address]  [facility] - Office  [phone]  Voice Recognition Reubin Milan/Dragon dictation system was used to create this note, attempts have been made to correct errors. Please contact the author with questions and/or clarifications.

## 2017-04-26 NOTE — Discharge Instructions (Signed)
Stop taking your clindamycin, instead start the new antibiotics as prescribed.  Return here as discussed if this infection is not responding within 2 days. Remember that the redness may spread a little bit beyond the marker dots over the next 24 hours, but then should start to improve.

## 2017-04-26 NOTE — ED Triage Notes (Signed)
Had I&D to left leg x2 days go. States is not any better. nad

## 2017-04-26 NOTE — ED Provider Notes (Signed)
[facility] Provider [facility]   CSN: [id number] Arrival date & time: 04/26/17  1325     History   Chief Complaint Chief Complaint  Patient presents with  . Wound Check    HPI Kenneth AbedJason L Vanderheyden is a 46 y.o. male with a distant history of mrsa infection presenting with an abscess on his left posterior upper thigh which is not responding the antibiotics or the I&D he underwent 2 days ago here.  He actually started clindamycin 5 days ago by his pcp for a sinus infection, but when the abscess developed he presented here for I&D.  The site has become more painful and the reddened area has spread.  He reports minimal drainage from the abscess site.  He has had subjective fever. He denies dizziness, weakness, body aches, n/v or other complaint.  His sinus infection is improving.  The history is provided by the patient.    Past Medical History:  Diagnosis Date  . Aortic stenosis   . Aortic stenosis   . Hypertension     There are no active problems to display for this patient.   Past Surgical History:  Procedure Laterality Date  . CARDIAC SURGERY    . CHOLECYSTECTOMY    . SHOULDER SURGERY         Home Medications    Prior to Admission medications   Medication Sig Start Date End Date Taking? Authorizing Provider  cephALEXin (KEFLEX) 500 MG capsule Take 1 capsule (500 mg total) by mouth 3 (three) times daily for 10 days. 04/26/17 05/06/17  Burgess AmorIdol, Tenesha Garza, PA-C  cyclobenzaprine (FLEXERIL) 10 MG tablet Take 1 Tablet by mouth 2 times a day AS NEEDED FOR MUSCLE SPASMS 10/02/16   Remus LofflerJones, Angel S, PA-C  diclofenac (VOLTAREN) 75 MG EC tablet Take 1 tablet (75 mg total) by mouth 2 (two) times daily. 09/27/13   Ivery QualeBryant, Hobson, PA-C  enalapril (VASOTEC) 5 MG tablet Take 1 Tablet by mouth once daily 10/05/16   Remus LofflerJones, Angel S, PA-C  HYDROcodone-acetaminophen (NORCO/VICODIN) 5-325 MG tablet Take 1 tablet by mouth every 6 (six) hours as needed. 04/24/17   Devoria AlbeKnapp, Iva, MD    sulfamethoxazole-trimethoprim (BACTRIM DS,SEPTRA DS) 800-160 MG tablet Take 2 tablets by mouth 2 (two) times daily for 10 days. 04/26/17 05/06/17  Burgess AmorIdol, Teliyah Royal, PA-C    Family History History reviewed. No pertinent family history.  Social History Social History   Tobacco Use  . Smoking status: Former Games developermoker  . Smokeless tobacco: Never Used  Substance Use Topics  . Alcohol use: No  . Drug use: No     Allergies   Patient has no known allergies.   Review of Systems Review of Systems  Constitutional: Positive for fever.  HENT: Negative for congestion and sore throat.   Eyes: Negative.   Respiratory: Negative for chest tightness and shortness of breath.   Cardiovascular: Negative for chest pain.  Gastrointestinal: Negative for abdominal pain and nausea.  Genitourinary: Negative.   Musculoskeletal: Negative for arthralgias, joint swelling and neck pain.  Skin: Positive for color change and wound. Negative for rash.  Neurological: Negative for dizziness, weakness, light-headedness, numbness and headaches.  Psychiatric/Behavioral: Negative.      Physical Exam Updated Vital Signs BP (!) 137/107 (BP Location: Left Arm)   Pulse 85   Temp 98.7 F (37.1 C) (Oral)   Resp 16   SpO2 98%   Physical Exam  Constitutional: He appears well-developed and well-nourished. No distress.  HENT:  Head: Normocephalic.  Neck: Neck  supple.  Cardiovascular: Normal rate.  Pulmonary/Chest: Effort normal. He has no wheezes.  Musculoskeletal: Normal range of motion. He exhibits no edema.  Skin: There is erythema.  Tender, open I&D site left upper posterior thigh without drainage.  Induration.  Surrounding erythema measuring 18 x 12 cm, no red streaking.  No groin or scrotal involvement.     ED Treatments / Results  Labs (all labs ordered are listed, but only abnormal results are displayed) Labs Reviewed  BASIC METABOLIC PANEL - Abnormal; Notable for the following components:      Result  Value   Calcium 10.5 (*)    All other components within normal limits  AEROBIC CULTURE (SUPERFICIAL SPECIMEN)  CBC WITH DIFFERENTIAL/PLATELET    EKG  EKG Interpretation None       Radiology No results found.  Informal bedside US performed.  There is a tiny amount of purulence directly beneath the I&D location, easily expressible. No need for further I&D at this time.   Procedures Procedures (including critical care time)  Medications Ordered in ED Medications  vancomycin (VANCOCIN) IVPB 1000 mg/200 mL premix (1,000 mg Intravenous New Bag/Given 04/26/17 1814)  morphine 4 MG/ML injection 4 mg (4 mg Intravenous Given 04/26/17 1710)  ondansetron (ZOFRAN) injection 4 mg (4 mg Intravenous Given 04/26/17 1710)  vancomycin (VANCOCIN) IVPB 1000 mg/200 mL premix (0 mg Intravenous Stopped 04/26/17 1810)     Initial Impression / Assessment and Plan / ED Course  I have reviewed the triage vital signs and the nursing notes.  Pertinent labs & imaging results that were available during my care of the patient were reviewed by me and considered in my medical decision making (see chart for details).     Pt with abscess/cellulitis not responding to oral clindamycin.  Pt given IV vancomycin, also treated pain while awaiting lab results.  He will need admission for continued IV abx.  Cellulitis edges marked.    Discussed with Dr. Rebeca AllegraEmokpoe who saw pt in ed.  Recommends abx change but does not need admission at this time.  Wound culture collected.  Recommends bactrim DS bid, keflex 500 tid x 10 days.    Call to pharmacy for vanc dosing for cellulitis - recommends vanc 2 grams, second gram ordered.  Plan recheck here if sx are not responding over the next 48 hours to this new abx regimen.   Final Clinical Impressions(s) / ED Diagnoses   Final diagnoses:  Cellulitis and abscess of left leg    ED Discharge Orders        Ordered    sulfamethoxazole-trimethoprim (BACTRIM DS,SEPTRA DS)  800-160 MG tablet  2 times daily     04/26/17 1812    cephALEXin (KEFLEX) 500 MG capsule  3 times daily     04/26/17 1812       Burgess Amordol, Adama Ivins, PA-C 04/26/17 1816    Jacalyn LefevreHaviland, Jennfier Abdulla, MD 04/26/17 1902

## 2017-04-29 LAB — AEROBIC CULTURE  (SUPERFICIAL SPECIMEN)
GRAM STAIN: NONE SEEN
SPECIAL REQUESTS: NORMAL

## 2017-04-29 LAB — AEROBIC CULTURE W GRAM STAIN (SUPERFICIAL SPECIMEN)

## 2017-04-29 MED FILL — Hydrocodone-Acetaminophen Tab 5-325 MG: ORAL | Qty: 6 | Status: AC

## 2017-04-30 ENCOUNTER — Telehealth: Payer: Self-pay | Admitting: Emergency Medicine

## 2017-04-30 NOTE — Telephone Encounter (Signed)
Post ED Visit - Positive Culture Follow-up  Culture report reviewed by antimicrobial stewardship pharmacist:  []  Enzo BiNathan Batchelder, Pharm.D. []  Celedonio MiyamotoJeremy Frens, Pharm.D., BCPS AQ-ID []  Garvin FilaMike Maccia, Pharm.D., BCPS []  Georgina PillionElizabeth Martin, Pharm.D., BCPS []  [place]uxburyMinh Pham, [address] Rainbow BoulevardPharm.D., BCPS, AAHIVP []  Estella HuskMichelle Turner, Pharm.D., BCPS, AAHIVP []  Lysle Pearlachel Rumbarger, PharmD, BCPS []  Casilda Carlsaylor Stone, PharmD, BCPS []  Pollyann SamplesAndy Johnston, PharmD, BCPS Dellie Catholiciana Raymond PharmD  Positive wound culture Treated with sulfamethoxazole-trimethoprim and cephalexin, organism sensitive to the same and no further patient follow-up is required at this time.  Berle MullMiller, Shree Espey 04/30/2017, 2:56 PM

## 2017-08-02 ENCOUNTER — Encounter (HOSPITAL_COMMUNITY): Payer: Self-pay | Admitting: Emergency Medicine

## 2017-08-02 ENCOUNTER — Emergency Department (HOSPITAL_COMMUNITY): Payer: PRIVATE HEALTH INSURANCE

## 2017-08-02 ENCOUNTER — Emergency Department (HOSPITAL_COMMUNITY)
Admission: EM | Admit: 2017-08-02 | Discharge: 2017-08-02 | Disposition: A | Discharge status: Home / Self Care | Payer: PRIVATE HEALTH INSURANCE | Attending: Emergency Medicine | Admitting: Emergency Medicine

## 2017-08-02 DIAGNOSIS — L0291 Cutaneous abscess, unspecified: Secondary | ICD-10-CM

## 2017-08-02 DIAGNOSIS — L02415 Cutaneous abscess of right lower limb: Secondary | ICD-10-CM | POA: Insufficient documentation

## 2017-08-02 DIAGNOSIS — I10 Essential (primary) hypertension: Secondary | ICD-10-CM | POA: Insufficient documentation

## 2017-08-02 DIAGNOSIS — Z79899 Other long term (current) drug therapy: Secondary | ICD-10-CM | POA: Insufficient documentation

## 2017-08-02 DIAGNOSIS — Z87891 Personal history of nicotine dependence: Secondary | ICD-10-CM | POA: Insufficient documentation

## 2017-08-02 DIAGNOSIS — L03115 Cellulitis of right lower limb: Secondary | ICD-10-CM | POA: Insufficient documentation

## 2017-08-02 LAB — COMPREHENSIVE METABOLIC PANEL
ALT: 33 U/L (ref 17–63)
ANION GAP: 12 (ref 5–15)
AST: 20 U/L (ref 15–41)
Albumin: 4.1 g/dL (ref 3.5–5.0)
Alkaline Phosphatase: 25 U/L — ABNORMAL LOW (ref 38–126)
BUN: 13 mg/dL (ref 6–20)
CO2: 22 mmol/L (ref 22–32)
CREATININE: 1.1 mg/dL (ref 0.61–1.24)
Calcium: 9.3 mg/dL (ref 8.9–10.3)
Chloride: 103 mmol/L (ref 101–111)
GFR calc Af Amer: >60 mL/min (ref >60)
GFR calc non Af Amer: >60 mL/min (ref >60)
GLUCOSE: 95 mg/dL (ref 65–99)
Potassium: 3.7 mmol/L (ref 3.5–5.1)
Sodium: 137 mmol/L (ref 135–145)
Total Bilirubin: 0.3 mg/dL (ref 0.3–1.2)
Total Protein: 7.1 g/dL (ref 6.5–8.1)

## 2017-08-02 LAB — CBC WITH DIFFERENTIAL/PLATELET
Basophils Absolute: 0 10*3/uL (ref 0.0–0.1)
Basophils Relative: 0 %
Eosinophils Absolute: 0.2 10*3/uL (ref 0.0–0.7)
Eosinophils Relative: 2 %
HEMATOCRIT: 44.3 % (ref 39.0–52.0)
Hemoglobin: 14.3 g/dL (ref 13.0–17.0)
Lymphocytes Relative: 17 %
Lymphs Abs: 1.9 10*3/uL (ref 0.7–4.0)
MCH: 29.5 pg (ref 26.0–34.0)
MCHC: 32.3 g/dL (ref 30.0–36.0)
MCV: 91.5 fL (ref 78.0–100.0)
MONO ABS: 0.8 10*3/uL (ref 0.1–1.0)
MONOS PCT: 8 %
NEUTROS ABS: 8.3 10*3/uL — AB (ref 1.7–7.7)
Neutrophils Relative %: 73 %
Platelets: 203 10*3/uL (ref 150–400)
RBC: 4.84 MIL/uL (ref 4.22–5.81)
RDW: 12.7 % (ref 11.5–15.5)
WBC: 11.2 10*3/uL — ABNORMAL HIGH (ref 4.0–10.5)

## 2017-08-02 LAB — LACTIC ACID, PLASMA: LACTIC ACID, VENOUS: 1.7 mmol/L (ref 0.5–1.9)

## 2017-08-02 MED ORDER — FENTANYL CITRATE (PF) 100 MCG/2ML IJ SOLN
INTRAMUSCULAR | Status: AC
  Filled 2017-08-02: qty 2

## 2017-08-02 MED ORDER — FENTANYL CITRATE (PF) 100 MCG/2ML IJ SOLN
50.0000 ug | Freq: Once | INTRAMUSCULAR | Status: AC
  Administered 2017-08-02: 50 ug via INTRAVENOUS

## 2017-08-02 MED ORDER — ACETAMINOPHEN 325 MG PO TABS
650.0000 mg | ORAL_TABLET | Freq: Once | ORAL | Status: AC
  Administered 2017-08-02: 650 mg via ORAL
  Filled 2017-08-02: qty 2

## 2017-08-02 MED ORDER — VANCOMYCIN HCL IN DEXTROSE 1-5 GM/200ML-% IV SOLN
1000.0000 mg | Freq: Once | INTRAVENOUS | Status: AC
  Administered 2017-08-02: 1000 mg via INTRAVENOUS
  Filled 2017-08-02: qty 200

## 2017-08-02 MED ORDER — OXYCODONE-ACETAMINOPHEN 5-325 MG PO TABS: 1.0000 | ORAL_TABLET | ORAL | 0 refills | Status: DC | PRN

## 2017-08-02 MED ORDER — CLINDAMYCIN HCL 150 MG PO CAPS: 150.0000 mg | ORAL_CAPSULE | Freq: Four times a day (QID) | ORAL | 0 refills | Status: DC

## 2017-08-02 MED ORDER — FENTANYL CITRATE (PF) 100 MCG/2ML IJ SOLN
INTRAMUSCULAR | Status: AC
  Administered 2017-08-02: 50 ug via INTRAVENOUS
  Filled 2017-08-02: qty 2

## 2017-08-02 MED ORDER — MORPHINE SULFATE (PF) 2 MG/ML IV SOLN
2.0000 mg | Freq: Once | INTRAVENOUS | Status: DC
  Filled 2017-08-02: qty 1

## 2017-08-02 MED ORDER — LIDOCAINE-EPINEPHRINE (PF) 2 %-1:200000 IJ SOLN
10.0000 mL | Freq: Once | INTRAMUSCULAR | Status: AC
  Administered 2017-08-02: 10 mL
  Filled 2017-08-02: qty 20

## 2017-08-02 NOTE — ED Notes (Signed)
Patient transported to X-ray 

## 2017-08-02 NOTE — ED Triage Notes (Addendum)
Pt reports abscess to right calf with redness extending from knee to ankle.  Ibuprofen 800mg  taken pta.

## 2017-08-02 NOTE — ED Provider Notes (Addendum)
[facility] EMERGENCY DEPARTMENT Provider Note   CSN: [id number] Arrival date & time: 08/02/17  1810     History   Chief Complaint Chief Complaint  Patient presents with  . Abscess    HPI Kenneth [name] is a 47 y.o. male.  HPI  47 year old man history of congenital aortic stenosis status post repair presents today complaining of infection to his right lower leg.  He states he noted an area that felt stinging yesterday.  Today he has had increased redness.  He noticed some discharge from the area today.  He had some chills prior to coming in.  He has not had any nausea or vomiting.  He has a history of having some MRSA infection in the past but this was several months ago and it was completely cleared up.  Past Medical History:  Diagnosis Date  . Aortic stenosis   . Aortic stenosis   . Hypertension     Patient Active Problem List   Diagnosis Date Noted  . Cellulitis of left Posterior thigh 04/26/2017    Past Surgical History:  Procedure Laterality Date  . CARDIAC SURGERY    . CHOLECYSTECTOMY    . SHOULDER SURGERY         Home Medications    Prior to Admission medications   Medication Sig Start Date End Date Taking? Authorizing Provider  cyclobenzaprine (FLEXERIL) 10 MG tablet Take 1 Tablet by mouth 2 times a day AS NEEDED FOR MUSCLE SPASMS 10/02/16   Remus Loffler, PA-C  diclofenac (VOLTAREN) 75 MG EC tablet Take 1 tablet (75 mg total) by mouth 2 (two) times daily. 09/27/13   Ivery Quale, PA-C  enalapril (VASOTEC) 5 MG tablet Take 1 Tablet by mouth once daily 10/05/16   Remus Loffler, PA-C  HYDROcodone-acetaminophen (NORCO/VICODIN) 5-325 MG tablet Take 1 tablet by mouth every 6 (six) hours as needed. 04/24/17   Devoria Albe, MD    Family History History reviewed. No pertinent family history.  Social History Social History   Tobacco Use  . Smoking status: Former Games developer  . Smokeless tobacco: Never Used  Substance Use Topics  . Alcohol use: No  . Drug use: No      Allergies   Patient has no known allergies.   Review of Systems Review of Systems  Constitutional: Positive for chills.  HENT: Negative.   Eyes: Negative.   Respiratory: Negative.   Cardiovascular: Negative.   Gastrointestinal: Negative.   Endocrine: Negative.   Genitourinary: Negative.   Musculoskeletal: Negative.   Skin: Positive for rash.  Neurological: Negative.   Hematological: Negative.   Psychiatric/Behavioral: Negative.   All other systems reviewed and are negative.    Physical Exam Updated Vital Signs BP (!) 149/103 (BP Location: Right Arm)   Pulse 96   Temp 99.6 F (37.6 C) (Oral)   Resp 18   Ht 1.702 m (5\' 7" )   Wt 99.8 kg (220 lb)   SpO2 100%   BMI 34.46 kg/m   Physical Exam  Constitutional: He is oriented to person, place, and time. He appears well-developed and well-nourished.  HENT:  Head: Normocephalic and atraumatic.  Eyes: EOM are normal. Pupils are equal, round, and reactive to light.  Neck: Normal range of motion.  Cardiovascular: Normal rate and regular rhythm.  Skilled scar anterior chest  Pulmonary/Chest: Effort normal and breath sounds normal.  Abdominal: Soft. Bowel sounds are normal.  Musculoskeletal:  Right lower extremity with area of excoriation and surrounding erythema on the lateral  aspect with redness extending inferiorly to the ankle and superiorly to the wound which is 10 cm distal from the knee No obvious discharge is noted.  The area is warm. These are intact feet bilaterally. No proximal l lymphadenopathy is noted. No erythema is noted proximal to the knee aspect of lower leg  Neurological: He is alert and oriented to person, place, and time.  Skin: Skin is warm and dry. Capillary refill takes less than 2 seconds.  Psychiatric: He has a normal mood and affect.  Nursing note and vitals reviewed.    ED Treatments / Results  Labs (all labs ordered are listed, but only abnormal results are displayed) Labs Reviewed    CBC WITH DIFFERENTIAL/PLATELET - Abnormal; Notable for the following components:      Result Value   WBC 11.2 (*)    Neutro Abs 8.3 (*)    All other components within normal limits  COMPREHENSIVE METABOLIC PANEL - Abnormal; Notable for the following components:   Alkaline Phosphatase 25 (*)    All other components within normal limits  CULTURE, BLOOD (ROUTINE X 2)  CULTURE, BLOOD (ROUTINE X 2)  LACTIC ACID, PLASMA  LACTIC ACID, PLASMA    EKG  EKG Interpretation None      Radiology Dg Tibia/fibula Right  Result Date: 08/02/2017 CLINICAL DATA:  Right lower leg abscess EXAM: RIGHT TIBIA AND FIBULA - 2 VIEW COMPARISON:  None. FINDINGS: No fracture or dislocation is seen. The joint spaces are preserved. Visualized soft tissues are within normal limits. No radiopaque foreign body is seen. IMPRESSION: No fracture, dislocation, or radiopaque foreign body is seen. Electronically Signed   By: Charline Bills M.D.   On: 08/02/2017 19:23    Procedures .Marland KitchenIncision and Drainage Date/Time: 08/02/2017 8:40 PM Performed by: Margarita Grizzle, MD Authorized by: Margarita Grizzle, MD   Consent:    Consent obtained:  Verbal   Consent given by:  Patient   Risks discussed:  Incomplete drainage, bleeding and pain   Alternatives discussed:  No treatment Location:    Type:  Abscess   Size:  1 cm x 0.4   Location:  Lower extremity Pre-procedure details:    Skin preparation:  Antiseptic wash and Betadine Sedation:    Sedation type:  Anxiolysis Anesthesia (see MAR for exact dosages):    Anesthesia method:  Local infiltration   Local anesthetic:  Lidocaine 1% WITH epi Procedure type:    Complexity:  Complex Procedure details:    Needle aspiration: no     Incision types:  Cruciate   Incision depth:  Submucosal   Scalpel blade:  11   Wound management:  Probed and deloculated and irrigated with saline   Drainage:  Purulent   Drainage amount:  Moderate   Wound treatment:  Wound left open   Packing  materials:  None Post-procedure details:    Patient tolerance of procedure:  Tolerated well, no immediate complications  [place] bedside Date/Time: 08/02/2017 8:57 PM Performed by: Margarita Grizzle, MD Authorized by: Margarita Grizzle, MD  Consent: Verbal consent obtained. Written consent not obtained. Consent given by: patient Patient understanding: patient states understanding of the procedure being performed Patient consent: the patient's understanding of the procedure matches consent given Required items: required blood products, implants, devices, and special equipment available Patient identity confirmed: verbally with patient Time out: Immediately prior to procedure a "time out" was called to verify the correct patient, procedure, equipment, support staff and site/side marked as required. Comments: Ultrasound of abscess with fluid collection  1 cm x 0.5    (including critical care time)  Medications Ordered in ED Medications  vancomycin (VANCOCIN) IVPB 1000 mg/200 mL premix (1,000 mg Intravenous New Bag/Given 08/02/17 1957)  fentaNYL (SUBLIMAZE) 100 MCG/2ML injection (not administered)  lidocaine-EPINEPHrine (XYLOCAINE W/EPI) 2 %-1:200000 (PF) injection 10 mL (10 mLs Infiltration Given 08/02/17 1957)  acetaminophen (TYLENOL) tablet 650 mg (650 mg Oral Given 08/02/17 2030)  fentaNYL (SUBLIMAZE) injection 50 mcg (50 mcg Intravenous Given 08/02/17 2030)     Initial Impression / Assessment and Plan / ED Course  I have reviewed the triage vital signs and the nursing notes.  Pertinent labs & imaging results that were available during my care of the patient were reviewed by me and considered in my medical decision making (see chart for details).    Patient is hypertensive, with known history, and is not tachycardic or febrile.  He has a mild leukocytosis at 11,200.  Other labs are within normal limits.  He is tolerated the procedure well.  He received IV vancomycin.  We have discussed outpatient  management.  He has good access.  Plan clindamycin.  Final Clinical Impressions(s) / ED Diagnoses   Final diagnoses:  Abscess  Cellulitis of right lower extremity    ED Discharge Orders    None       Margarita Grizzleay, Kaydin Karbowski, MD 08/02/17 2052    Margarita Grizzleay, Ashlie Mcmenamy, MD 08/02/17 [phone]

## 2017-08-02 NOTE — Discharge Instructions (Signed)
Take all medication as prescribed. Elevate leg. Return if any spreading redness, high fever, or unable to tolerate medications (vomiting).

## 2017-08-02 NOTE — ED Notes (Signed)
Pt ambulatory to waiting room. Pt verbalized understanding of discharge instructions.   

## 2017-08-05 MED FILL — Oxycodone w/ Acetaminophen Tab 5-325 MG: ORAL | Qty: 6 | Status: AC

## 2017-08-07 LAB — CULTURE, BLOOD (ROUTINE X 2)
CULTURE: NO GROWTH
Culture: NO GROWTH
SPECIAL REQUESTS: ADEQUATE
Special Requests: ADEQUATE

## 2017-08-14 ENCOUNTER — Other Ambulatory Visit: Payer: Self-pay

## 2017-08-14 ENCOUNTER — Encounter (HOSPITAL_COMMUNITY): Payer: Self-pay | Admitting: *Deleted

## 2017-08-14 ENCOUNTER — Emergency Department (HOSPITAL_COMMUNITY)
Admission: EM | Admit: 2017-08-14 | Discharge: 2017-08-15 | Disposition: A | Discharge status: Home / Self Care | Payer: Worker's Compensation | Attending: Emergency Medicine | Admitting: Emergency Medicine

## 2017-08-14 DIAGNOSIS — L02415 Cutaneous abscess of right lower limb: Secondary | ICD-10-CM | POA: Diagnosis not present

## 2017-08-14 DIAGNOSIS — R600 Localized edema: Secondary | ICD-10-CM | POA: Diagnosis present

## 2017-08-14 DIAGNOSIS — Z87891 Personal history of nicotine dependence: Secondary | ICD-10-CM | POA: Insufficient documentation

## 2017-08-14 DIAGNOSIS — Z79899 Other long term (current) drug therapy: Secondary | ICD-10-CM | POA: Insufficient documentation

## 2017-08-14 DIAGNOSIS — L0291 Cutaneous abscess, unspecified: Secondary | ICD-10-CM

## 2017-08-14 NOTE — ED Triage Notes (Signed)
Pt c/o pain and swelling to right lower leg; pt states he just finished a round of antibiotics with no improvement

## 2017-08-15 MED ORDER — CEPHALEXIN 500 MG PO CAPS
500.0000 mg | ORAL_CAPSULE | Freq: Once | ORAL | Status: AC
Start: 2017-08-15 — End: 2017-08-15
  Administered 2017-08-15: 500 mg via ORAL
  Filled 2017-08-15: qty 1

## 2017-08-15 MED ORDER — HYDROCODONE-ACETAMINOPHEN 5-325 MG PO TABS: 1.0000 | ORAL_TABLET | Freq: Four times a day (QID) | ORAL | 0 refills | Status: DC | PRN

## 2017-08-15 MED ORDER — SULFAMETHOXAZOLE-TRIMETHOPRIM 800-160 MG PO TABS: 2.0000 | ORAL_TABLET | Freq: Two times a day (BID) | ORAL | 0 refills | Status: AC

## 2017-08-15 MED ORDER — CEPHALEXIN 500 MG PO CAPS: 500.0000 mg | ORAL_CAPSULE | Freq: Four times a day (QID) | ORAL | 0 refills | Status: DC

## 2017-08-15 MED ORDER — LIDOCAINE HCL (PF) 1 % IJ SOLN
5.0000 mL | Freq: Once | INTRAMUSCULAR | Status: AC
  Administered 2017-08-15: 5 mL
  Filled 2017-08-15: qty 6

## 2017-08-15 MED ORDER — SULFAMETHOXAZOLE-TRIMETHOPRIM 800-160 MG PO TABS
2.0000 | ORAL_TABLET | Freq: Once | ORAL | Status: AC
  Administered 2017-08-15: 2 via ORAL
  Filled 2017-08-15: qty 2

## 2017-08-15 NOTE — ED Provider Notes (Signed)
[facility] EMERGENCY DEPARTMENT Provider Note   CSN: [id number] Arrival date & time: 08/14/17  2040     History   Chief Complaint Chief Complaint  Patient presents with  . Abscess    HPI Kenneth [name] is a 47 y.o. male with a past medical history of aortic stenosis, mrsa and HTN, was seen here 13 days ago for cellulitis and abscess of his right leg felt to possibly starting as an insect bite.  He had I&D of the site along with IV vancomycin followed by 10 days of clindamycin which he finished 2 days ago. Since finishing the antibiotics he has had increased pain and swelling around the site and also increased purulent drainage from the wound, stating he had to change his bandaid about 7 time today. He denies radiation of pain beyond the immediate lateral calf (had pain from his ankle to his upper thigh at first presentation).  He denies fevers, chills, n/v but is concerned about his increased symptoms since finishing the antibiotics. He has been doing warm epsom salt soaks daily.  The history is provided by the patient.    Past Medical History:  Diagnosis Date  . Aortic stenosis   . Aortic stenosis   . Hypertension     Patient Active Problem List   Diagnosis Date Noted  . Cellulitis of left Posterior thigh 04/26/2017    Past Surgical History:  Procedure Laterality Date  . CARDIAC SURGERY    . CHOLECYSTECTOMY    . SHOULDER SURGERY         Home Medications    Prior to Admission medications   Medication Sig Start Date End Date Taking? Authorizing Provider  cephALEXin (KEFLEX) 500 MG capsule Take 1 capsule (500 mg total) by mouth 4 (four) times daily. 08/15/17   Burgess Amor, PA-C  clindamycin (CLEOCIN) 150 MG capsule Take 1 capsule (150 mg total) by mouth every 6 (six) hours. 08/02/17   Margarita Grizzle, MD  cyclobenzaprine (FLEXERIL) 10 MG tablet Take 1 Tablet by mouth 2 times a day AS NEEDED FOR MUSCLE SPASMS 10/02/16   Remus Loffler, PA-C  diclofenac (VOLTAREN) 75 MG EC  tablet Take 1 tablet (75 mg total) by mouth 2 (two) times daily. 09/27/13   Ivery Quale, PA-C  enalapril (VASOTEC) 5 MG tablet Take 1 Tablet by mouth once daily 10/05/16   Remus Loffler, PA-C  HYDROcodone-acetaminophen (NORCO/VICODIN) 5-325 MG tablet Take 1 tablet by mouth every 6 (six) hours as needed for moderate pain. 08/15/17   Burgess Amor, PA-C  oxyCODONE-acetaminophen (PERCOCET/ROXICET) 5-325 MG tablet Take 1 tablet by mouth every 4 (four) hours as needed for severe pain. 08/02/17   Margarita Grizzle, MD  sulfamethoxazole-trimethoprim (BACTRIM DS,SEPTRA DS) 800-160 MG tablet Take 2 tablets by mouth 2 (two) times daily for 10 days. 08/15/17 08/25/17  Burgess Amor, PA-C    Family History History reviewed. No pertinent family history.  Social History Social History   Tobacco Use  . Smoking status: Former Games developer  . Smokeless tobacco: Never Used  Substance Use Topics  . Alcohol use: No  . Drug use: No     Allergies   Patient has no known allergies.   Review of Systems Review of Systems  Constitutional: Negative for chills and fever.  Respiratory: Negative for shortness of breath and wheezing.   Skin: Positive for wound.  Neurological: Negative for numbness.     Physical Exam Updated Vital Signs BP (!) 127/92   Pulse 64   Temp 98.6  F (37 C) (Oral)   Resp 17   Ht 5\' 7"  (1.702 m)   Wt 99.8 kg (220 lb)   SpO2 97%   BMI 34.46 kg/m   Physical Exam  Constitutional: He is oriented to person, place, and time. He appears well-developed and well-nourished.  HENT:  Head: Normocephalic.  Cardiovascular: Normal rate.  Pulmonary/Chest: Effort normal.  Lymphadenopathy:  No inguinal adenopathy.  Neurological: He is alert and oriented to person, place, and time. No sensory deficit.  Skin: Skin is warm. There is erythema.  Erythema immediately surrounding a sealed I&D site right lateral mid calf.  Central punctum sealed by a wbc plug, small amount of purulent drainage with gentle  pressure to the site.  No red streaking. No significant cellulitis.     ED Treatments / Results  Labs (all labs ordered are listed, but only abnormal results are displayed) Labs Reviewed - No data to display  EKG  EKG Interpretation None       Radiology No results found.   Informal bedside US performed with a small pus pocket appreciated immediately beneath the plugged I&D site.  Procedures Procedures (including critical care time)  INCISION AND DRAINAGE Performed by: Burgess AmorIDOL, Jerzy Roepke Consent: Verbal consent obtained. Risks and benefits: risks, benefits and alternatives were discussed Type: abscess  Body area: right calf  Anesthesia: local infiltration  No incision required.  .  Local anesthetic: lidocaine 1% without epinephrine  Anesthetic total: 4 ml  Complexity: complex Blunt dissection to break up loculations  Drainage: purulent  Drainage amount: small amount of purulence after removing wbc "clot" from surface of abscess site  Packing material:  None  Patient tolerance: Patient tolerated the procedure well with no immediate complications.     Medications Ordered in ED Medications  cephALEXin (KEFLEX) capsule 500 mg (not administered)  sulfamethoxazole-trimethoprim (BACTRIM DS,SEPTRA DS) 800-160 MG per tablet 2 tablet (not administered)  lidocaine (PF) (XYLOCAINE) 1 % injection 5 mL (5 mLs Other Given by Other 08/15/17 0119)     Initial Impression / Assessment and Plan / ED Course  I have reviewed the triage vital signs and the nursing notes.  Pertinent labs & imaging results that were available during my care of the patient were reviewed by me and considered in my medical decision making (see chart for details).     Review of chart revealing pt had prior similar infection left posterior thigh which also did not respond to clindamycin, but did respond to combo of DS bactrim and keflex. Will start on this combination.  Hydrocodone. Warm water soaks  discussed. Prn f/u anticipated.  Discussed return precautions.   Final Clinical Impressions(s) / ED Diagnoses   Final diagnoses:  Abscess    ED Discharge Orders        Ordered    cephALEXin (KEFLEX) 500 MG capsule  4 times daily     08/15/17 0134    sulfamethoxazole-trimethoprim (BACTRIM DS,SEPTRA DS) 800-160 MG tablet  2 times daily     08/15/17 0134    HYDROcodone-acetaminophen (NORCO/VICODIN) 5-325 MG tablet  Every 6 hours PRN     08/15/17 0135       Burgess Amordol, Winda Summerall, PA-C 08/15/17 0155    Glynn Octaveancour, Stephen, MD 08/15/17 [phone]

## 2017-08-15 NOTE — Discharge Instructions (Signed)
Continue applying warm epsom salt soaks 1-2 times daily.  Take the entire course of both antibiotics prescribed.  You may take the hydrocodone given for pain relief.  This will make you drowsy - do not drive within 4 hours of taking this medication.

## 2017-08-15 NOTE — ED Notes (Signed)
ED Provider at bedside. 

## 2017-08-16 MED FILL — Hydrocodone-Acetaminophen Tab 5-325 MG: ORAL | Qty: 6 | Status: AC

## 2018-06-04 HISTORY — PX: SHOULDER SURGERY: SHX246

## 2018-06-24 ENCOUNTER — Other Ambulatory Visit: Payer: Self-pay

## 2018-06-24 ENCOUNTER — Emergency Department (HOSPITAL_COMMUNITY)
Admission: EM | Admit: 2018-06-24 | Discharge: 2018-06-24 | Disposition: A | Discharge status: Home / Self Care | Payer: PRIVATE HEALTH INSURANCE | Attending: Emergency Medicine | Admitting: Emergency Medicine

## 2018-06-24 ENCOUNTER — Encounter (HOSPITAL_COMMUNITY): Payer: Self-pay | Admitting: Emergency Medicine

## 2018-06-24 DIAGNOSIS — R2241 Localized swelling, mass and lump, right lower limb: Secondary | ICD-10-CM | POA: Diagnosis not present

## 2018-06-24 DIAGNOSIS — Z5321 Procedure and treatment not carried out due to patient leaving prior to being seen by health care provider: Secondary | ICD-10-CM | POA: Diagnosis not present

## 2018-06-24 NOTE — ED Triage Notes (Signed)
Patient complaining of abscess to upper right leg x 1 week.

## 2018-06-24 NOTE — ED Notes (Signed)
Registration state the pt decided to leave due to long wait time.

## 2018-10-02 ENCOUNTER — Other Ambulatory Visit: Payer: Self-pay

## 2018-10-02 ENCOUNTER — Ambulatory Visit: Payer: Self-pay

## 2018-10-02 ENCOUNTER — Other Ambulatory Visit: Payer: Self-pay | Admitting: Family Medicine

## 2018-10-02 DIAGNOSIS — M25511 Pain in right shoulder: Secondary | ICD-10-CM

## 2020-05-24 ENCOUNTER — Other Ambulatory Visit: Payer: Self-pay | Admitting: Internal Medicine

## 2020-05-24 ENCOUNTER — Other Ambulatory Visit (HOSPITAL_COMMUNITY): Payer: Self-pay | Admitting: Internal Medicine

## 2021-05-19 ENCOUNTER — Ambulatory Visit
Admission: EM | Admit: 2021-05-19 | Discharge: 2021-05-19 | Disposition: A | Discharge status: Home / Self Care | Payer: No Typology Code available for payment source | Attending: Family Medicine | Admitting: Family Medicine

## 2021-05-19 ENCOUNTER — Other Ambulatory Visit: Payer: Self-pay

## 2021-05-19 ENCOUNTER — Ambulatory Visit (INDEPENDENT_AMBULATORY_CARE_PROVIDER_SITE_OTHER): Payer: No Typology Code available for payment source

## 2021-05-19 ENCOUNTER — Encounter: Payer: Self-pay | Admitting: Emergency Medicine

## 2021-05-19 DIAGNOSIS — S9032XA Contusion of left foot, initial encounter: Secondary | ICD-10-CM

## 2021-05-19 DIAGNOSIS — M79672 Pain in left foot: Secondary | ICD-10-CM

## 2021-05-19 NOTE — ED Triage Notes (Signed)
Dropped a ladder on left foot a week ago.  States he has a knot on the area and hurts to walk. Bruising noted to lower toe area across foot.

## 2021-05-19 NOTE — ED Provider Notes (Signed)
[facility]    CSN: [id number] Arrival date & time: 05/19/21  1931      History   Chief Complaint No chief complaint on file.   HPI Kenneth [name] is a 50 y.o. male.   Presenting today with a persistent knot, bruising, swelling, pain in the distal left foot after dropping a small ladder onto the area about a week ago.  He states he has been walking on it and has not tried anything over-the-counter for symptoms but with it being ongoing he wanted to get it checked out.  He denies numbness, tingling, decreased range of motion, weakness.   Past Medical History:  Diagnosis Date   Aortic stenosis    Aortic stenosis    Hypertension     Patient Active Problem List   Diagnosis Date Noted   Cellulitis of left Posterior thigh 04/26/2017    Past Surgical History:  Procedure Laterality Date   CARDIAC SURGERY     CHOLECYSTECTOMY     SHOULDER SURGERY         Home Medications    Prior to Admission medications   Medication Sig Start Date End Date Taking? Authorizing Provider  cephALEXin (KEFLEX) 500 MG capsule Take 1 capsule (500 mg total) by mouth 4 (four) times daily. 08/15/17   Burgess Amor, PA-C  clindamycin (CLEOCIN) 150 MG capsule Take 1 capsule (150 mg total) by mouth every 6 (six) hours. 08/02/17   Margarita Grizzle, MD  cyclobenzaprine (FLEXERIL) 10 MG tablet Take 1 Tablet by mouth 2 times a day AS NEEDED FOR MUSCLE SPASMS 10/02/16   Remus Loffler, PA-C  diclofenac (VOLTAREN) 75 MG EC tablet Take 1 tablet (75 mg total) by mouth 2 (two) times daily. 09/27/13   Ivery Quale, PA-C  enalapril (VASOTEC) 5 MG tablet Take 1 Tablet by mouth once daily 10/05/16   Remus Loffler, PA-C  HYDROcodone-acetaminophen (NORCO/VICODIN) 5-325 MG tablet Take 1 tablet by mouth every 6 (six) hours as needed for moderate pain. 08/15/17   Burgess Amor, PA-C  oxyCODONE-acetaminophen (PERCOCET/ROXICET) 5-325 MG tablet Take 1 tablet by mouth every 4 (four) hours as needed for severe pain. 08/02/17    Margarita Grizzle, MD    Family History History reviewed. No pertinent family history.  Social History Social History   Tobacco Use   Smoking status: Former   Smokeless tobacco: Never  Substance Use Topics   Alcohol use: No   Drug use: No     Allergies   Patient has no known allergies.   Review of Systems Review of Systems Per HPI  Physical Exam Triage Vital Signs ED Triage Vitals  Enc Vitals Group     BP 05/19/21 1939 (!) 155/99     Pulse Rate 05/19/21 1939 90     Resp 05/19/21 1939 18     Temp 05/19/21 1939 98.8 F (37.1 C)     Temp Source 05/19/21 1939 Oral     SpO2 05/19/21 1939 98 %     Weight --      Height --      Head Circumference --      Peak Flow --      Pain Score 05/19/21 1940 4     Pain Loc --      Pain Edu? --      Excl. in GC? --    No data found.  Updated Vital Signs BP (!) 155/99 (BP Location: Right Arm)    Pulse 90    Temp 98.8  F (37.1 C) (Oral)    Resp 18    SpO2 98%   Visual Acuity Right Eye Distance:   Left Eye Distance:   Bilateral Distance:    Right Eye Near:   Left Eye Near:    Bilateral Near:     Physical Exam Vitals and nursing note reviewed.  Constitutional:      Appearance: Normal appearance.  HENT:     Head: Atraumatic.  Eyes:     Extraocular Movements: Extraocular movements intact.     Conjunctiva/sclera: Conjunctivae normal.  Cardiovascular:     Rate and Rhythm: Normal rate and regular rhythm.  Pulmonary:     Effort: Pulmonary effort is normal.     Breath sounds: Normal breath sounds.  Musculoskeletal:        General: Swelling, tenderness and signs of injury present. Normal range of motion.     Cervical back: Normal range of motion and neck supple.     Comments: Swelling bruising and tenderness to palpation distal left foot dorsally.  Range of motion intact, minimally antalgic gait.  Skin:    General: Skin is warm and dry.     Findings: Bruising present.     Comments: Diffuse bruising distally left foot    Neurological:     General: No focal deficit present.     Mental Status: He is oriented to person, place, and time.     Comments: Left foot neurovascularly intact  Psychiatric:        Mood and Affect: Mood normal.        Thought Content: Thought content normal.        Judgment: Judgment normal.     UC Treatments / Results  Labs (all labs ordered are listed, but only abnormal results are displayed) Labs Reviewed - No data to display  EKG   Radiology DG Foot Complete Left  Result Date: 05/19/2021 CLINICAL DATA:  Dropped ladder on foot 1 week ago with persistent pain, initial encounter EXAM: LEFT FOOT - COMPLETE 3+ VIEW COMPARISON:  None. FINDINGS: Soft tissue swelling is noted along the metatarsal heads dorsally. No acute fracture or dislocation is seen. No other focal abnormality is noted. IMPRESSION: Soft tissue swelling without acute bony abnormality. Electronically Signed   By: Alcide Clever M.D.   On: 05/19/2021 19:50    Procedures Procedures (including critical care time)  Medications Ordered in UC Medications - No data to display  Initial Impression / Assessment and Plan / UC Course  I have reviewed the triage vital signs and the nursing notes.  Pertinent labs & imaging results that were available during my care of the patient were reviewed by me and considered in my medical decision making (see chart for details).     X-ray left foot negative for acute bony abnormality, suspect contusion to the left foot causing his symptoms.  Postop shoe applied for comfort, discussed Epsom salt soaks, RICE protocol, over-the-counter pain relievers as needed.  Podiatry follow-up for unresolving symptoms  Final Clinical Impressions(s) / UC Diagnoses   Final diagnoses:  Contusion of left foot, initial encounter   Discharge Instructions   None    ED Prescriptions   None    PDMP not reviewed this encounter.   Particia Nearing, [place] 05/19/21 2010

## 2021-10-11 NOTE — Patient Instructions (Addendum)
DUE TO COVID-19 ONLY TWO VISITORS  (aged 53 and older)  ARE ALLOWED TO COME WITH YOU AND STAY IN THE WAITING ROOM ONLY DURING PRE OP AND PROCEDURE.   ?**NO VISITORS ARE ALLOWED IN THE SHORT STAY AREA OR RECOVERY ROOM!!** ? ?IF YOU WILL BE ADMITTED INTO THE HOSPITAL YOU ARE ALLOWED ONLY FOUR SUPPORT PEOPLE DURING VISITATION HOURS ONLY (7 AM -8PM)   ?The support person(s) must pass our screening, gel in and out, and wear a mask at all times, including in the patient?s room. ?Patients must also wear a mask when staff or their support person are in the room. ?Visitors GUEST BADGE MUST BE WORN VISIBLY  ?One adult visitor may remain with you overnight and MUST be in the room by 8 P.M. ?  ? ? Your procedure is scheduled on: 10/26/21 ? ? Report to [facility] Main Entrance ? ?  Report to Short Stay at : 5:15 AM ? ? Call this number if you have problems the morning of surgery [phone] ? ? Do not eat food :After Midnight. ? ? After Midnight you may have the following liquids until: 4:30 AM DAY OF SURGERY ? ?Water ?Black Coffee (sugar ok, NO MILK/CREAM OR CREAMERS)  ?Tea (sugar ok, NO MILK/CREAM OR CREAMERS) regular and decaf                             ?Plain Jell-O (NO RED)                                           ?Fruit ices (not with fruit pulp, NO RED)                                     ?Popsicles (NO RED)                                                                  ?Juice: apple, WHITE grape, WHITE cranberry ?Sports drinks like Gatorade (NO RED) ?Clear broth(vegetable,chicken,beef) ? ?Drink  Ensure drink AT : 4:30 AM the day of surgery.   ?  ?The day of surgery:  ?Drink ONE (1) Pre-Surgery Clear Ensure or G2 at AM the morning of surgery. Drink in one sitting. Do not sip.  ?This drink was given to you during your hospital  ?pre-op appointment visit. ?Nothing else to drink after completing the  ?Pre-Surgery Clear Ensure or G2. ?  ?       If you have questions, please contact your surgeon?s office.  ?   ?Oral Hygiene is also important to reduce your risk of infection.                                    ?Remember - BRUSH YOUR TEETH THE MORNING OF SURGERY WITH YOUR REGULAR TOOTHPASTE ? ? Do NOT smoke after Midnight ? ? Take these medicines the morning of surgery with A SIP OF WATER: N/A ? ?DO NOT TAKE ANY  ORAL DIABETIC MEDICATIONS DAY OF YOUR SURGERY ? ?Bring CPAP mask and tubing day of surgery. ?                  ?           You may not have any metal on your body including hair pins, jewelry, and body piercing ? ?           Do not wear  lotions, powders, perfumes/cologne, or deodorant ? ?            Men may shave face and neck. ? ? Do not bring valuables to the hospital. [facility] NOT ?            RESPONSIBLE   FOR VALUABLES. ? ? Contacts, dentures or bridgework may not be worn into surgery. ? ? Bring small overnight bag day of surgery. ?  ? Patients discharged on the day of surgery will not be allowed to drive home.  Someone NEEDS to stay with you for the first 24 hours after anesthesia. ? ? Special Instructions: Bring a copy of your healthcare power of attorney and living will documents         the day of surgery if you haven't scanned them before. ? ?            Please read over the following fact sheets you were given: IF [place] [phone] ? ?   [place] - Preparing for Surgery ?Before surgery, you can play an important role.  Because skin is not sterile, your skin needs to be as free of germs as possible.  You can reduce the number of germs on your skin by washing with CHG (chlorahexidine gluconate) soap before surgery.  CHG is an antiseptic cleaner which kills germs and bonds with the skin to continue killing germs even after washing. ?Please DO NOT use if you have an allergy to CHG or antibacterial soaps.  If your skin becomes reddened/irritated stop using the CHG and inform your nurse when you arrive at Short Stay. ?Do not shave (including  legs and underarms) for at least 48 hours prior to the first CHG shower.  You may shave your face/neck. ?Please follow these instructions carefully: ? 1.  Shower with CHG Soap the night before surgery and the  morning of Surgery. ? 2.  If you choose to wash your hair, wash your hair first as usual with your  normal  shampoo. ? 3.  After you shampoo, rinse your hair and body thoroughly to remove the  shampoo.                           4.  Use CHG as you would any other liquid soap.  You can apply chg directly  to the skin and wash  ?                     Gently with a scrungie or clean washcloth. ? 5.  Apply the CHG Soap to your body ONLY FROM THE NECK DOWN.   Do not use on face/ open      ?                     Wound or open sores. Avoid contact with eyes, ears mouth and genitals (private parts).  ?  Wash face,  Genitals (private parts) with your normal soap. ?            6.  Wash thoroughly, paying special attention to the area where your surgery  will be performed. ? 7.  Thoroughly rinse your body with warm water from the neck down. ? 8.  DO NOT shower/wash with your normal soap after using and rinsing off  the CHG Soap. ?               9.  Pat yourself dry with a clean towel. ?           10.  Wear clean pajamas. ?           11.  Place clean sheets on your bed the night of your first shower and do not  sleep with pets. ?Day of Surgery : ?Do not apply any lotions/deodorants the morning of surgery.  Please wear clean clothes to the hospital/surgery center. ? ?FAILURE TO FOLLOW THESE INSTRUCTIONS MAY RESULT IN THE CANCELLATION OF YOUR SURGERY ?PATIENT SIGNATURE_________________________________ ? ?NURSE SIGNATURE__________________________________ ? ?________________________________________________________________________ [facility]- Preparing for Total Shoulder Arthroplasty  ?  ?Before surgery, you can play an important role. Because skin is not sterile, your skin needs to be as free of germs as  possible. You can reduce the number of germs on your skin by using the following products. ?Benzoyl Peroxide Gel ?Reduces the number of germs present on the skin ?Applied twice a day to shoulder area starting two days before surgery   ? ?================================================================== ? ?Please follow these instructions carefully: ? ?BENZOYL PEROXIDE 5% GEL ? ?Please do not use if you have an allergy to benzoyl peroxide.   If your skin becomes reddened/irritated stop using the benzoyl peroxide. ? ?Starting two days before surgery, apply as follows: ?Apply benzoyl peroxide in the morning and at night. Apply after taking a shower. If you are not taking a shower clean entire shoulder front, back, and side along with the armpit with a clean wet washcloth. ? ?Place a quarter-sized dollop on your shoulder and rub in thoroughly, making sure to cover the front, back, and side of your shoulder, along with the armpit.  ? ?2 days before ____ AM   ____ PM              1 day before ____ AM   ____ PM ?                        ?Do this twice a day for two days.  (Last application is the night before surgery, AFTER using the CHG soap as described below). ? ?Do NOT apply benzoyl peroxide gel on the day of surgery.  ? ?Incentive Spirometer ? ?An incentive spirometer is a tool that can help keep your lungs clear and active. This tool measures how well you are filling your lungs with each breath. Taking long deep breaths may help reverse or decrease the chance of developing breathing (pulmonary) problems (especially infection) following: ?A long period of time when you are unable to move or be active. ?BEFORE THE PROCEDURE  ?If the spirometer includes an indicator to show your best effort, your nurse or respiratory therapist will set it to a desired goal. ?If possible, sit up straight or lean slightly forward. Try not to slouch. ?Hold the incentive spirometer in an upright position. ?INSTRUCTIONS FOR USE  ?Sit on the  edge of your bed if possible, or sit up as  far as you can in bed or on a chair. ?Hold the incentive spirometer in an upright position. ?Breathe out normally. ?Place the mouthpiece in your mouth and seal your li

## 2021-10-13 ENCOUNTER — Encounter (INDEPENDENT_AMBULATORY_CARE_PROVIDER_SITE_OTHER): Payer: Self-pay

## 2021-10-13 ENCOUNTER — Encounter (HOSPITAL_COMMUNITY): Payer: Self-pay

## 2021-10-13 ENCOUNTER — Encounter (HOSPITAL_COMMUNITY)
Admission: RE | Admit: 2021-10-13 | Discharge: 2021-10-13 | Disposition: A | Discharge status: Home / Self Care | Payer: Worker's Compensation | Source: Ambulatory Visit | Attending: Orthopedic Surgery | Admitting: Orthopedic Surgery

## 2021-10-13 VITALS — BP 153/107 | HR 82 | Temp 98.6°F | Ht 66.0 in | Wt 255.0 lb

## 2021-10-13 DIAGNOSIS — Z01818 Encounter for other preprocedural examination: Secondary | ICD-10-CM

## 2021-10-13 DIAGNOSIS — I2510 Atherosclerotic heart disease of native coronary artery without angina pectoris: Secondary | ICD-10-CM

## 2021-10-13 HISTORY — DX: Unspecified osteoarthritis, unspecified site: M19.90

## 2021-10-13 LAB — SURGICAL PCR SCREEN
MRSA, PCR: NEGATIVE
Staphylococcus aureus: NEGATIVE

## 2021-10-13 LAB — BASIC METABOLIC PANEL
Anion gap: 10 (ref 5–15)
BUN: 13 mg/dL (ref 6–20)
CO2: 24 mmol/L (ref 22–32)
Calcium: 9.4 mg/dL (ref 8.9–10.3)
Chloride: 104 mmol/L (ref 98–111)
Creatinine, Ser: 0.93 mg/dL (ref 0.61–1.24)
GFR, Estimated: >60 mL/min (ref >60)
Glucose, Bld: 90 mg/dL (ref 70–99)
Potassium: 3.6 mmol/L (ref 3.5–5.1)
Sodium: 138 mmol/L (ref 135–145)

## 2021-10-13 LAB — CBC
HCT: 43 % (ref 39.0–52.0)
Hemoglobin: 14.7 g/dL (ref 13.0–17.0)
MCH: 30.8 pg (ref 26.0–34.0)
MCHC: 34.2 g/dL (ref 30.0–36.0)
MCV: 90 fL (ref 80.0–100.0)
Platelets: 188 10*3/uL (ref 150–400)
RBC: 4.78 MIL/uL (ref 4.22–5.81)
RDW: 12.7 % (ref 11.5–15.5)
WBC: 6.5 10*3/uL (ref 4.0–10.5)
nRBC: 0 % (ref 0.0–0.2)

## 2021-10-13 NOTE — Progress Notes (Signed)
For Short Stay: ?COVID SWAB appointment date: ?Date of COVID positive in last 90 days: ? ?Bowel Prep reminder: ? ? ?For Anesthesia: ?PCP - Gwenith Spitz: DO ?Cardiologist -  ? ?Chest x-ray -  ?EKG -  ?Stress Test -  ?ECHO -  ?Cardiac Cath -  ?Pacemaker/ICD device last checked: ?Pacemaker orders received: ?Device Rep notified: ? ?Spinal Cord Stimulator: ? ?Sleep Study -  ?CPAP -  ? ?Fasting Blood Sugar -  ?Checks Blood Sugar _____ times a day ?Date and result of last Hgb A1c- ? ?Blood Thinner Instructions: ?Aspirin Instructions: ?Last Dose: ? ?Activity level: Can go up a flight of stairs and activities of daily living without stopping and without chest pain and/or shortness of breath ?  Able to exercise without chest pain and/or shortness of breath ?  Unable to go up a flight of stairs without chest pain and/or shortness of breath ?   ? ?Anesthesia review: HX: HTN,Aorta stenosis. ? ?Patient denies shortness of breath, fever, cough and chest pain at PAT appointment ? ? ?Patient verbalized understanding of instructions that were given to them at the PAT appointment. Patient was also instructed that they will need to review over the PAT instructions again at home before surgery.  ?

## 2021-10-13 NOTE — Progress Notes (Signed)
For Short Stay: ?[place] appointment date: ?Date of COVID positive in last 90 days: ? ?Bowel Prep reminder: ? ? ?For Anesthesia: ?PCP - DO: Flo Shanks ?Cardiologist - NO ? ?Chest x-ray -  ?EKG -  ?Stress Test -  ?ECHO -  ?Cardiac Cath -  ?Pacemaker/ICD device last checked: ?Pacemaker orders received: ?Device Rep notified: ? ?Spinal Cord Stimulator: ? ?Sleep Study -  ?CPAP -  ? ?Fasting Blood Sugar -  ?Checks Blood Sugar _____ times a day ?Date and result of last Hgb A1c- ? ?Blood Thinner Instructions: ?Aspirin Instructions: ?Last Dose: ? ?Activity level: Can go up a flight of stairs and activities of daily living without stopping and without chest pain and/or shortness of breath ?  Able to exercise without chest pain and/or shortness of breath ?  Unable to go up a flight of stairs without chest pain and/or shortness of breath ?   ? ?Anesthesia review: Hx: HTN,Aorta Stenosis. ? ?Patient denies shortness of breath, fever, cough and chest pain at PAT appointment ? ? ?Patient verbalized understanding of instructions that were given to them at the PAT appointment. Patient was also instructed that they will need to review over the PAT instructions again at home before surgery.  ?

## 2021-10-13 NOTE — Progress Notes (Signed)
?   10/13/21 1015  ?OBSTRUCTIVE SLEEP APNEA  ?Have you ever been diagnosed with sleep apnea through a sleep study? No  ?Do you snore loudly (loud enough to be heard through closed doors)?  1  ?Do you often feel tired, fatigued, or sleepy during the daytime (such as falling asleep during driving or talking to someone)? 0  ?Has anyone observed you stop breathing during your sleep? 0  ?Do you have, or are you being treated for high blood pressure? 1  ?BMI more than 35 kg/m2? 1  ?Age > 50 (1-yes) 1  ?Neck circumference greater than:Male 16 inches or larger, Male 17inches or larger? 1  ?Male Gender (Yes=1) 1  ?Obstructive Sleep Apnea Score 6  ? ? ?

## 2021-10-24 NOTE — Progress Notes (Signed)
Anesthesia Chart Review   Case: [id number] Date/Time: 10/26/21 0715   Procedure: TOTAL SHOULDER ARTHROPLASTY (Right: Shoulder) -   Anesthesia type: General   Pre-op diagnosis: right shoulder osteoarthritis   Location: [facility] ROOM 06 / [facility]   Surgeons: Francena Hanly, MD       DISCUSSION:51 y.o. former smoker with h/o HTN, aortic valvuloplasty as a child, right shoulder OA scheduled for above procedure 10/26/2021.   H/o aortic valvuloplasty when 51 years old, followed at [facility] until he was in his 30s.  At that time advised to follow up prn.  He followed with PCP regularly.  Denies cv sx, good functional capacity. Discussed with Dr. Okey Dupre.  Anticipate pt can proceed with planned procedure barring acute status change.    VS: BP (!) 153/107   Pulse 82   Temp 37 C (Oral)   Ht 5\' 6"  (1.676 m)   Wt 115.7 kg   SpO2 95%   BMI 41.16 kg/m   PROVIDERS: , NP is PCP    LABS: Labs reviewed: Acceptable for surgery. (all labs ordered are listed, but only abnormal results are displayed)  Labs Reviewed  SURGICAL PCR SCREEN  CBC  BASIC METABOLIC PANEL     IMAGES:   EKG: 10/13/2021 Rate 68 bpm Normal sinus rhythm Low voltage QRS Inferior infarct , age undetermined Cannot rule out Anterior infarct , age undetermined Nonspecific ST and T wave abnormality Abnormal ECG  CV:  Past Medical History:  Diagnosis Date   Aortic stenosis    Aortic stenosis    Arthritis    Hypertension     Past Surgical History:  Procedure Laterality Date   CARDIAC SURGERY     CHOLECYSTECTOMY     SHOULDER SURGERY      MEDICATIONS:  albuterol (VOSPIRE ER) 8 MG 12 hr tablet   Aspirin-Acetaminophen-Caffeine (GOODYS EXTRA STRENGTH) 12/13/2021 MG PACK   cyclobenzaprine (FLEXERIL) 10 MG tablet   ibuprofen (ADVIL) 200 MG tablet   Multiple Vitamins-Minerals (AIRBORNE GUMMIES) CHEW   No current facility-administered medications for this encounter.   [phone] [place], PA-C [facility]  Pre-Surgical Testing [phone]

## 2021-10-24 NOTE — Anesthesia Preprocedure Evaluation (Addendum)
Anesthesia Evaluation  Patient identified by MRN, date of birth, ID band Patient awake    Reviewed: Allergy & Precautions, NPO status , Patient's Chart, lab work & pertinent test results  History of Anesthesia Complications Negative for: history of anesthetic complications  Airway Mallampati: II  TM Distance: >3 FB Neck ROM: Full    Dental  (+) Dental Advisory Given, Edentulous Upper   Pulmonary former smoker,    Pulmonary exam normal        Cardiovascular hypertension (no meds), Normal cardiovascular exam+ Valvular Problems/Murmurs (aortic valvuloplasty at 51 yr old)      Neuro/Psych negative neurological ROS  negative psych ROS   GI/Hepatic negative GI ROS, Neg liver ROS,   Endo/Other  Morbid obesity  Renal/GU negative Renal ROS     Musculoskeletal  (+) Arthritis ,   Abdominal   Peds  Hematology negative hematology ROS (+)   Anesthesia Other Findings   Reproductive/Obstetrics                           Anesthesia Physical Anesthesia Plan  ASA: 3  Anesthesia Plan: General   Post-op Pain Management: Tylenol PO (pre-op)* and Regional block*   Induction: Intravenous  PONV Risk Score and Plan: 2 and Treatment may vary due to age or medical condition, Ondansetron, Dexamethasone and Midazolam  Airway Management Planned: Oral ETT  Additional Equipment: None  Intra-op Plan:   Post-operative Plan: Extubation in OR  Informed Consent: I have reviewed the patients History and Physical, chart, labs and discussed the procedure including the risks, benefits and alternatives for the proposed anesthesia with the patient or authorized representative who has indicated his/her understanding and acceptance.     Dental advisory given  Plan Discussed with: CRNA and Anesthesiologist  Anesthesia Plan Comments:       Anesthesia Quick Evaluation

## 2021-10-26 ENCOUNTER — Ambulatory Visit (HOSPITAL_COMMUNITY)
Admission: RE | Admit: 2021-10-26 | Discharge: 2021-10-26 | Disposition: A | Discharge status: Home / Self Care | Payer: Worker's Compensation | Source: Ambulatory Visit | Attending: Orthopedic Surgery | Admitting: Orthopedic Surgery

## 2021-10-26 ENCOUNTER — Encounter (HOSPITAL_COMMUNITY)
Admission: RE | Disposition: A | Discharge status: Home / Self Care | Payer: Self-pay | Source: Ambulatory Visit | Attending: Orthopedic Surgery

## 2021-10-26 ENCOUNTER — Ambulatory Visit (HOSPITAL_COMMUNITY): Payer: Worker's Compensation | Admitting: Physician Assistant

## 2021-10-26 ENCOUNTER — Other Ambulatory Visit: Payer: Self-pay

## 2021-10-26 ENCOUNTER — Encounter (HOSPITAL_COMMUNITY): Payer: Self-pay | Admitting: Orthopedic Surgery

## 2021-10-26 ENCOUNTER — Ambulatory Visit (HOSPITAL_BASED_OUTPATIENT_CLINIC_OR_DEPARTMENT_OTHER): Payer: Worker's Compensation | Admitting: Anesthesiology

## 2021-10-26 DIAGNOSIS — I10 Essential (primary) hypertension: Secondary | ICD-10-CM

## 2021-10-26 DIAGNOSIS — Z6841 Body Mass Index (BMI) 40.0 and over, adult: Secondary | ICD-10-CM | POA: Insufficient documentation

## 2021-10-26 DIAGNOSIS — Z87891 Personal history of nicotine dependence: Secondary | ICD-10-CM | POA: Insufficient documentation

## 2021-10-26 DIAGNOSIS — M19011 Primary osteoarthritis, right shoulder: Secondary | ICD-10-CM | POA: Insufficient documentation

## 2021-10-26 DIAGNOSIS — Z952 Presence of prosthetic heart valve: Secondary | ICD-10-CM | POA: Insufficient documentation

## 2021-10-26 HISTORY — PX: TOTAL SHOULDER ARTHROPLASTY: SHX126

## 2021-10-26 SURGERY — ARTHROPLASTY, SHOULDER, TOTAL
Anesthesia: General | Site: Shoulder | Laterality: Right

## 2021-10-26 MED ORDER — TRANEXAMIC ACID-NACL 1000-0.7 MG/100ML-% IV SOLN
1000.0000 mg | INTRAVENOUS | Status: AC
  Administered 2021-10-26: 1000 mg via INTRAVENOUS
  Filled 2021-10-26: qty 100

## 2021-10-26 MED ORDER — FENTANYL CITRATE (PF) 100 MCG/2ML IJ SOLN
INTRAMUSCULAR | Status: DC | PRN
  Administered 2021-10-26 (×2): 50 ug via INTRAVENOUS
  Administered 2021-10-26: 100 ug via INTRAVENOUS

## 2021-10-26 MED ORDER — STERILE WATER FOR IRRIGATION IR SOLN
Status: DC | PRN
  Administered 2021-10-26: 2000 mL

## 2021-10-26 MED ORDER — PROPOFOL 10 MG/ML IV BOLUS
INTRAVENOUS | Status: AC
  Filled 2021-10-26: qty 20

## 2021-10-26 MED ORDER — ONDANSETRON HCL 4 MG PO TABS: 4.0000 mg | ORAL_TABLET | Freq: Three times a day (TID) | ORAL | 0 refills | Status: DC | PRN

## 2021-10-26 MED ORDER — ONDANSETRON HCL 4 MG/2ML IJ SOLN
INTRAMUSCULAR | Status: AC
  Filled 2021-10-26: qty 2

## 2021-10-26 MED ORDER — CEFAZOLIN SODIUM-DEXTROSE 2-4 GM/100ML-% IV SOLN
2.0000 g | INTRAVENOUS | Status: AC
  Administered 2021-10-26: 2 g via INTRAVENOUS
  Filled 2021-10-26: qty 100

## 2021-10-26 MED ORDER — ACETAMINOPHEN 500 MG PO TABS
1000.0000 mg | ORAL_TABLET | Freq: Once | ORAL | Status: AC
  Administered 2021-10-26: 1000 mg via ORAL
  Filled 2021-10-26: qty 2

## 2021-10-26 MED ORDER — 0.9 % SODIUM CHLORIDE (POUR BTL) OPTIME
TOPICAL | Status: DC | PRN
Start: 2021-10-26 — End: 2021-10-26
  Administered 2021-10-26: 1000 mL

## 2021-10-26 MED ORDER — VANCOMYCIN HCL 1000 MG IV SOLR
INTRAVENOUS | Status: AC
  Filled 2021-10-26: qty 20

## 2021-10-26 MED ORDER — FENTANYL CITRATE (PF) 100 MCG/2ML IJ SOLN
INTRAMUSCULAR | Status: AC
Start: 2021-10-26 — End: ?
  Filled 2021-10-26: qty 2

## 2021-10-26 MED ORDER — LACTATED RINGERS IV SOLN: INTRAVENOUS | Status: DC

## 2021-10-26 MED ORDER — ONDANSETRON HCL 4 MG/2ML IJ SOLN
INTRAMUSCULAR | Status: DC | PRN
  Administered 2021-10-26: 4 mg via INTRAVENOUS

## 2021-10-26 MED ORDER — ONDANSETRON HCL 4 MG PO TABS: 4.0000 mg | ORAL_TABLET | Freq: Four times a day (QID) | ORAL | Status: DC | PRN

## 2021-10-26 MED ORDER — MIDAZOLAM HCL 5 MG/5ML IJ SOLN
INTRAMUSCULAR | Status: DC | PRN
  Administered 2021-10-26: 2 mg via INTRAVENOUS

## 2021-10-26 MED ORDER — LIDOCAINE 2% (20 MG/ML) 5 ML SYRINGE
INTRAMUSCULAR | Status: DC | PRN
  Administered 2021-10-26: 80 mg via INTRAVENOUS

## 2021-10-26 MED ORDER — FENTANYL CITRATE PF 50 MCG/ML IJ SOSY
PREFILLED_SYRINGE | INTRAMUSCULAR | Status: AC
  Filled 2021-10-26: qty 1

## 2021-10-26 MED ORDER — BUPIVACAINE HCL (PF) 0.5 % IJ SOLN
INTRAMUSCULAR | Status: DC | PRN
  Administered 2021-10-26: 15 mL via PERINEURAL

## 2021-10-26 MED ORDER — IBUPROFEN 800 MG PO TABS: 800.0000 mg | ORAL_TABLET | Freq: Three times a day (TID) | ORAL | 1 refills | Status: DC | PRN

## 2021-10-26 MED ORDER — EPHEDRINE SULFATE-NACL 50-0.9 MG/10ML-% IV SOSY
PREFILLED_SYRINGE | INTRAVENOUS | Status: DC | PRN
  Administered 2021-10-26: 10 mg via INTRAVENOUS

## 2021-10-26 MED ORDER — VANCOMYCIN HCL 1000 MG IV SOLR
INTRAVENOUS | Status: DC | PRN
  Administered 2021-10-26: 1000 mg

## 2021-10-26 MED ORDER — DEXAMETHASONE SODIUM PHOSPHATE 10 MG/ML IJ SOLN
INTRAMUSCULAR | Status: DC | PRN
  Administered 2021-10-26: 10 mg via INTRAVENOUS

## 2021-10-26 MED ORDER — CYCLOBENZAPRINE HCL 10 MG PO TABS: 10.0000 mg | ORAL_TABLET | Freq: Three times a day (TID) | ORAL | 1 refills | Status: DC | PRN

## 2021-10-26 MED ORDER — METOCLOPRAMIDE HCL 5 MG PO TABS: 5.0000 mg | ORAL_TABLET | Freq: Three times a day (TID) | ORAL | Status: DC | PRN

## 2021-10-26 MED ORDER — OXYCODONE-ACETAMINOPHEN 5-325 MG PO TABS: 1.0000 | ORAL_TABLET | ORAL | 0 refills | Status: DC | PRN

## 2021-10-26 MED ORDER — OXYCODONE HCL 5 MG PO TABS
ORAL_TABLET | ORAL | Status: AC
  Filled 2021-10-26: qty 1

## 2021-10-26 MED ORDER — MIDAZOLAM HCL 2 MG/2ML IJ SOLN
INTRAMUSCULAR | Status: AC
  Filled 2021-10-26: qty 2

## 2021-10-26 MED ORDER — LACTATED RINGERS IV BOLUS
250.0000 mL | Freq: Once | INTRAVENOUS | Status: AC
  Administered 2021-10-26: 250 mL via INTRAVENOUS

## 2021-10-26 MED ORDER — ROCURONIUM BROMIDE 10 MG/ML (PF) SYRINGE
PREFILLED_SYRINGE | INTRAVENOUS | Status: DC | PRN
  Administered 2021-10-26: 60 mg via INTRAVENOUS

## 2021-10-26 MED ORDER — PROPOFOL 10 MG/ML IV BOLUS
INTRAVENOUS | Status: DC | PRN
  Administered 2021-10-26: 200 mg via INTRAVENOUS

## 2021-10-26 MED ORDER — ONDANSETRON HCL 4 MG/2ML IJ SOLN: 4.0000 mg | Freq: Once | INTRAMUSCULAR | Status: DC | PRN

## 2021-10-26 MED ORDER — OXYCODONE HCL 5 MG PO TABS
5.0000 mg | ORAL_TABLET | Freq: Once | ORAL | Status: AC | PRN
  Administered 2021-10-26: 5 mg via ORAL

## 2021-10-26 MED ORDER — OXYCODONE HCL 5 MG/5ML PO SOLN: 5.0000 mg | Freq: Once | ORAL | Status: AC | PRN

## 2021-10-26 MED ORDER — FENTANYL CITRATE (PF) 100 MCG/2ML IJ SOLN
INTRAMUSCULAR | Status: AC
  Filled 2021-10-26: qty 2

## 2021-10-26 MED ORDER — SUGAMMADEX SODIUM 200 MG/2ML IV SOLN
INTRAVENOUS | Status: DC | PRN
  Administered 2021-10-26: 200 mg via INTRAVENOUS

## 2021-10-26 MED ORDER — CHLORHEXIDINE GLUCONATE 0.12 % MT SOLN
15.0000 mL | Freq: Once | OROMUCOSAL | Status: AC
  Administered 2021-10-26: 15 mL via OROMUCOSAL

## 2021-10-26 MED ORDER — DEXAMETHASONE SODIUM PHOSPHATE 10 MG/ML IJ SOLN
INTRAMUSCULAR | Status: AC
  Filled 2021-10-26: qty 1

## 2021-10-26 MED ORDER — ORAL CARE MOUTH RINSE: 15.0000 mL | Freq: Once | OROMUCOSAL | Status: AC

## 2021-10-26 MED ORDER — PHENYLEPHRINE 80 MCG/ML (10ML) SYRINGE FOR IV PUSH (FOR BLOOD PRESSURE SUPPORT)
PREFILLED_SYRINGE | INTRAVENOUS | Status: DC | PRN
  Administered 2021-10-26 (×5): 80 ug via INTRAVENOUS

## 2021-10-26 MED ORDER — LACTATED RINGERS IV BOLUS
500.0000 mL | Freq: Once | INTRAVENOUS | Status: AC
  Administered 2021-10-26: 500 mL via INTRAVENOUS

## 2021-10-26 MED ORDER — METOCLOPRAMIDE HCL 5 MG/ML IJ SOLN
5.0000 mg | Freq: Three times a day (TID) | INTRAMUSCULAR | Status: DC | PRN
  Administered 2021-10-26: 10 mg via INTRAVENOUS

## 2021-10-26 MED ORDER — ALBUTEROL SULFATE HFA 108 (90 BASE) MCG/ACT IN AERS
INHALATION_SPRAY | RESPIRATORY_TRACT | Status: DC | PRN
  Administered 2021-10-26: 4 via RESPIRATORY_TRACT

## 2021-10-26 MED ORDER — METOCLOPRAMIDE HCL 5 MG/ML IJ SOLN
INTRAMUSCULAR | Status: AC
  Filled 2021-10-26: qty 2

## 2021-10-26 MED ORDER — FENTANYL CITRATE PF 50 MCG/ML IJ SOSY
25.0000 ug | PREFILLED_SYRINGE | INTRAMUSCULAR | Status: DC | PRN
  Administered 2021-10-26: 50 ug via INTRAVENOUS

## 2021-10-26 MED ORDER — ONDANSETRON HCL 4 MG/2ML IJ SOLN
4.0000 mg | Freq: Four times a day (QID) | INTRAMUSCULAR | Status: DC | PRN
Start: 2021-10-26 — End: 2021-10-26

## 2021-10-26 MED ORDER — BUPIVACAINE LIPOSOME 1.3 % IJ SUSP
INTRAMUSCULAR | Status: DC | PRN
  Administered 2021-10-26: 10 mL via PERINEURAL

## 2021-10-26 SURGICAL SUPPLY — 72 items
ADH SKN CLS APL DERMABOND .7 (GAUZE/BANDAGES/DRESSINGS) ×1
AID PSTN UNV HD RSTRNT DISP (MISCELLANEOUS) ×1
BAG COUNTER SPONGE SURGICOUNT (BAG) ×1 IMPLANT
BAG SPEC THK2 15X12 ZIP CLS (MISCELLANEOUS) ×1
BAG SPNG CNTER NS LX DISP (BAG) ×1
BAG ZIPLOCK 12X15 (MISCELLANEOUS) ×2 IMPLANT
BLADE SAW SGTL 83.5X18.5 (BLADE) ×2 IMPLANT
BODY TRUNION ECLIPSE 43 SL (Shoulder) ×1 IMPLANT
CEMENT BONE DEPUY (Cement) ×2 IMPLANT
CLSR STERI-STRIP ANTIMIC 1/2X4 (GAUZE/BANDAGES/DRESSINGS) ×1 IMPLANT
COOLER ICEMAN CLASSIC (MISCELLANEOUS) ×2 IMPLANT
COVER BACK TABLE 60X90IN (DRAPES) ×2 IMPLANT
COVER SURGICAL LIGHT HANDLE (MISCELLANEOUS) ×2 IMPLANT
DERMABOND ADVANCED (GAUZE/BANDAGES/DRESSINGS) ×1
DERMABOND ADVANCED .7 DNX12 (GAUZE/BANDAGES/DRESSINGS) ×1 IMPLANT
DRAPE ORTHO SPLIT 77X108 STRL (DRAPES) ×4
DRAPE SHEET LG 3/4 BI-LAMINATE (DRAPES) ×2 IMPLANT
DRAPE SURG 17X11 SM STRL (DRAPES) ×2 IMPLANT
DRAPE SURG ORHT 6 SPLT 77X108 (DRAPES) ×2 IMPLANT
DRAPE TOP 10253 STERILE (DRAPES) ×2 IMPLANT
DRAPE U-SHAPE 47X51 STRL (DRAPES) ×2 IMPLANT
DRSG AQUACEL AG ADV 3.5X10 (GAUZE/BANDAGES/DRESSINGS) ×2 IMPLANT
DURAPREP 26ML APPLICATOR (WOUND CARE) ×2 IMPLANT
ELECT BLADE TIP CTD 4 INCH (ELECTRODE) ×2 IMPLANT
ELECT PENCIL ROCKER SW 15FT (MISCELLANEOUS) ×2 IMPLANT
ELECT REM PT RETURN 15FT ADLT (MISCELLANEOUS) ×2 IMPLANT
FACESHIELD WRAPAROUND (MASK) ×8 IMPLANT
FACESHIELD WRAPAROUND OR TEAM (MASK) ×4 IMPLANT
GLENOID WITH CLEAT MEDIUM (Shoulder) ×1 IMPLANT
GLOVE BIO SURGEON STRL SZ7.5 (GLOVE) ×2 IMPLANT
GLOVE BIO SURGEON STRL SZ8 (GLOVE) ×2 IMPLANT
GLOVE SS BIOGEL STRL SZ 7 (GLOVE) ×1 IMPLANT
GLOVE SS BIOGEL STRL SZ 7.5 (GLOVE) ×1 IMPLANT
GLOVE SUPERSENSE BIOGEL SZ 7 (GLOVE) ×3
GLOVE SUPERSENSE BIOGEL SZ 7.5 (GLOVE) ×2
GOWN STRL REIN XL XLG (GOWN DISPOSABLE) ×4 IMPLANT
HEAD HUM ECLIPSE 45/19 (Shoulder) ×1 IMPLANT
IMPL ECLIPSE SPEEDCAP (Shoulder) IMPLANT
IMPLANT ECLIPSE SPEEDCAP (Shoulder) ×2 IMPLANT
KIT BASIN OR (CUSTOM PROCEDURE TRAY) ×2 IMPLANT
KIT TURNOVER KIT A (KITS) ×1 IMPLANT
MANIFOLD NEPTUNE II (INSTRUMENTS) ×2 IMPLANT
NDL TAPERED W/ NITINOL LOOP (MISCELLANEOUS) IMPLANT
NEEDLE TAPERED W/ NITINOL LOOP (MISCELLANEOUS) ×2 IMPLANT
NS IRRIG 1000ML POUR BTL (IV SOLUTION) ×2 IMPLANT
PACK SHOULDER (CUSTOM PROCEDURE TRAY) ×2 IMPLANT
PAD COLD SHLDR WRAP-ON (PAD) ×2 IMPLANT
PIN NITINOL TARGETER 2.8 (PIN) ×1 IMPLANT
PIN SET MODULAR GLENOID SYSTEM (PIN) ×1 IMPLANT
PROTECTOR NERVE ULNAR (MISCELLANEOUS) ×2 IMPLANT
RESTRAINT HEAD UNIVERSAL NS (MISCELLANEOUS) ×2 IMPLANT
SCREW SPINAL 40 F/CAGE LRG (Screw) ×1 IMPLANT
SLING ARM FOAM STRAP LRG (SOFTGOODS) ×1 IMPLANT
SLING ARM FOAM STRAP MED (SOFTGOODS) IMPLANT
SLING ARM FOAM STRAP XLG (SOFTGOODS) ×1 IMPLANT
SMARTMIX MINI TOWER (MISCELLANEOUS) ×2
SPONGE T-LAP 4X18 ~~LOC~~+RFID (SPONGE) IMPLANT
SUCTION FRAZIER HANDLE 12FR (TUBING) ×2
SUCTION TUBE FRAZIER 12FR DISP (TUBING) ×1 IMPLANT
SUT FIBERWIRE #2 38 T-5 BLUE (SUTURE) ×2
SUT MNCRL AB 3-0 PS2 18 (SUTURE) ×2 IMPLANT
SUT MON AB 2-0 CT1 36 (SUTURE) ×2 IMPLANT
SUT VIC AB 1 CT1 36 (SUTURE) ×2 IMPLANT
SUTURE FIBERWR #2 38 T-5 BLUE (SUTURE) IMPLANT
SUTURE TAPE 1.3 40 TPR END (SUTURE) IMPLANT
SUTURETAPE 1.3 40 TPR END (SUTURE)
TOWEL OR 17X26 10 PK STRL BLUE (TOWEL DISPOSABLE) ×2 IMPLANT
TOWEL OR NON WOVEN STRL DISP B (DISPOSABLE) ×2 IMPLANT
TOWER SMARTMIX MINI (MISCELLANEOUS) IMPLANT
TUBE SUCTION HIGH CAP CLEAR NV (SUCTIONS) ×2 IMPLANT
WATER STERILE IRR 1000ML POUR (IV SOLUTION) ×4 IMPLANT
YANKAUER SUCT BULB TIP 10FT TU (MISCELLANEOUS) ×2 IMPLANT

## 2021-10-26 NOTE — Anesthesia Postprocedure Evaluation (Signed)
Anesthesia Post Note  Patient: Kenneth [name]  Procedure(s) Performed: TOTAL SHOULDER ARTHROPLASTY (Right: Shoulder)     Patient location during evaluation: PACU Anesthesia Type: General Level of consciousness: awake and alert Pain management: pain level controlled Vital Signs Assessment: post-procedure vital signs reviewed and stable Respiratory status: spontaneous breathing, nonlabored ventilation and respiratory function stable Cardiovascular status: stable and blood pressure returned to baseline Anesthetic complications: no   No notable events documented.  Last Vitals:  Vitals:   10/26/21 1045 10/26/21 1050  BP: 106/81   Pulse: 79 75  Resp: 17 (!) 21  Temp: 36.4 C   SpO2: 98% 93%    Last Pain:  Vitals:   10/26/21 1045  TempSrc:   PainSc: [place]

## 2021-10-26 NOTE — Transfer of Care (Signed)
Immediate Anesthesia Transfer of Care Note  Patient: Kenneth [name]  Procedure(s) Performed: Procedure(s) with comments: TOTAL SHOULDER ARTHROPLASTY (Right) -  Patient Location: PACU  Anesthesia Type:General  Level of Consciousness: Alert, Awake, Oriented  Airway & Oxygen Therapy: Patient Spontanous Breathing  Post-op Assessment: Report given to RN  Post vital signs: Reviewed and stable  Last Vitals:  Vitals:   10/26/21 0556  BP: (!) 153/104  Pulse: 85  Resp: 18  Temp: 36.9 C  SpO2: 95%    Complications: No apparent anesthesia complications

## 2021-10-26 NOTE — Anesthesia Procedure Notes (Addendum)
Anesthesia Regional Block: Interscalene brachial plexus block   Pre-Anesthetic Checklist: , timeout performed,  Correct Patient, Correct Site, Correct Laterality,  Correct Procedure, Correct Position, site marked,  Risks and benefits discussed,  Surgical consent,  Pre-op evaluation,  At surgeon's request and post-op pain management  Laterality: Right  Prep: chloraprep       Needles:  Injection technique: Single-shot  Needle Type: Echogenic Needle     Needle Length: 5cm  Needle Gauge: 21     Additional Needles:   Narrative:  Start time: 10/26/2021 6:48 AM End time: 10/26/2021 6:54 AM Injection made incrementally with aspirations every 5 mL.  Performed by: Personally  Anesthesiologist: Beryle Lathe, MD  Additional Notes: No pain on injection. No increased resistance to injection. Injection made in 5cc increments. Good needle visualization. Patient tolerated the procedure well.

## 2021-10-26 NOTE — H&P (Signed)
Kenneth [name]    Chief Complaint: right shoulder osteoarthritis HPI: The patient is a 51 y.o. male with chronic and progressively increasing right shoulder pain related to severe glenohumeral joint osteoarthritis.  Due to his increasing functional limitations and failure to respond to prolonged attempts at conservative management, he is brought to the operating room at this time for planned right shoulder anatomic arthroplasty  Past Medical History:  Diagnosis Date   Aortic stenosis    Aortic stenosis    Arthritis    Hypertension       Past Surgical History:  Procedure Laterality Date   CARDIAC SURGERY     CHOLECYSTECTOMY     SHOULDER SURGERY      History reviewed. No pertinent family history.  Social History:  reports that he has quit smoking. He has never used smokeless tobacco. He reports current alcohol use. He reports that he does not use drugs.  BMI: Estimated body mass index is 41.16 kg/m as calculated from the following:   Height as of 10/13/21: 5\' 6"  (1.676 m).   Weight as of 10/13/21: 115.7 kg.  Lab Results  Component Value Date   ALBUMIN 4.1 08/02/2017   Diabetes: Patient does not have a diagnosis of diabetes.     Smoking Status: Social History   Tobacco Use  Smoking Status Former  Smokeless Tobacco Never   The patient is not currently a tobacco user. Counseling given: Not Answered      Medications Prior to Admission  Medication Sig Dispense Refill   albuterol (VOSPIRE ER) 8 MG 12 hr tablet Take 8 mg by mouth as needed.     Aspirin-Acetaminophen-Caffeine (GOODYS EXTRA STRENGTH) 500-325-65 MG PACK Take 1 packet by mouth daily as needed (pain).     cyclobenzaprine (FLEXERIL) 10 MG tablet Take 1 Tablet by mouth 2 times a day AS NEEDED FOR MUSCLE SPASMS (Patient taking differently: Take 10 mg by mouth at bedtime as needed for muscle spasms.) 60 tablet 0   doxycycline (VIBRA-TABS) 100 MG tablet Take 100 mg by mouth 2 (two) times daily.     ibuprofen  (ADVIL) 200 MG tablet Take 600-800 mg by mouth every 8 (eight) hours as needed for moderate pain.     Multiple Vitamins-Minerals (AIRBORNE GUMMIES) CHEW Chew 2 each by mouth at bedtime.       Physical Exam: Right shoulder demonstrates painful motion as noted at recent office visits.  He maintains excellent strength to manual muscle testing.  Neurovascular intact in the right upper extremity.  Examination otherwise as documented at previous office visits.  Plain radiographs confirm severe osteoarthritis with complete obliteration of the joint space, subchondral sclerosis, and peripheral osteophyte formation.  Vitals  Temp:  [98.4 F (36.9 C)] 98.4 F (36.9 C) (05/25 0556) Pulse Rate:  [85] 85 (05/25 0556) Resp:  [18] 18 (05/25 0556) BP: (153)/(104) 153/104 (05/25 0556) SpO2:  [95 %] 95 % (05/25 0556)  Assessment/Plan  Impression: right shoulder osteoarthritis  Plan of Action: Procedure(s): TOTAL SHOULDER ARTHROPLASTY  Shanasia Ibrahim M Dariush Mcnellis 10/26/2021, 6:41 AM Contact # [phone]

## 2021-10-26 NOTE — Evaluation (Signed)
Occupational Therapy Evaluation Patient Details Name: Kenneth [name] MRN: [id number] DOB: 02/18/1971 Today's Date: 10/26/2021   History of Present Illness Patietn s/p right TSA   Clinical Impression   [name] is a 51 year old man s/p shoulder replacement without functional use of right dominant upper extremity secondary to effects of surgery and interscalene block and shoulder precautions. Therapist provided education and instruction to patient and spouse in regards to exercises, precautions, positioning, donning upper extremity clothing and bathing while maintaining shoulder precautions, ice and edema management and donning/doffing sling. Patient and spouse verbalized understanding and demonstrated as needed. Patient needed assistance to donn shirt, pants, and sling. Patient has assistance of wife at home. Patient to follow up with MD for further therapy needs.        Recommendations for follow up therapy are one component of a multi-disciplinary discharge planning process, led by the attending physician.  Recommendations may be updated based on patient status, additional functional criteria and insurance authorization.   Follow Up Recommendations  Follow physician's recommendations for discharge plan and follow up therapies    Assistance Recommended at Discharge Intermittent Supervision/Assistance  Patient can return home with the following A little help with bathing/dressing/bathroom;Assistance with cooking/housework    Functional Status Assessment  Patient has had a recent decline in their functional status and demonstrates the ability to make significant improvements in function in a reasonable and predictable amount of time.  Equipment Recommendations  None recommended by OT    Recommendations for Other Services       Precautions / Restrictions Precautions Precautions: Shoulder Type of Shoulder Precautions: May come out of sling if sitting in controlled environment. ie  while watching tv, eating etc to give neck and skin break from sling. Please sleep in sling though until 4 weeks post op.     Ok to use operative arm to assist in feeding, bathing, ADL's.       New PROM restrictions (8/18) for use in hygiene and ADL only   ER 20   ABD 45   FE 60     Pendulums are to be gentle and are the preferred exercise to be instructed for patients to perform at home.( Along with elbow wrist and hand exercise) Shoulder Interventions: Shoulder sling/immobilizer;Off for dressing/bathing/exercises Precaution Booklet Issued: Yes (comment) Required Braces or Orthoses: Sling Restrictions Weight Bearing Restrictions: Yes RUE Weight Bearing: Non weight bearing      Mobility Bed Mobility                    Transfers                          Balance                                           ADL either performed or assessed with clinical judgement   ADL Overall ADL's : Needs assistance/impaired Eating/Feeding: Set up   Grooming: Modified independent   Upper Body Bathing: Minimal assistance   Lower Body Bathing: Minimal assistance   Upper Body Dressing : Maximal assistance   Lower Body Dressing: Minimal assistance   Toilet Transfer: Modified Independent   Toileting- Clothing Manipulation and Hygiene: Minimal assistance               Vision Patient Visual Report: No change from baseline  Perception     Praxis      Pertinent Vitals/Pain Pain Assessment Pain Assessment: Faces Faces Pain Scale: Hurts little more Pain Location: anterior shoulder Pain Descriptors / Indicators: Grimacing Pain Intervention(s): Monitored during session     Hand Dominance     Extremity/Trunk Assessment Upper Extremity Assessment Upper Extremity Assessment: RUE deficits/detail RUE Deficits / Details: impaired motor and sensation due to block   Lower Extremity Assessment Lower Extremity Assessment: Overall WFL for tasks  assessed   Cervical / Trunk Assessment Cervical / Trunk Assessment: Normal   Communication     Cognition Arousal/Alertness: Suspect due to medications Behavior During Therapy: WFL for tasks assessed/performed Overall Cognitive Status: Within Functional Limits for tasks assessed                                       General Comments       Exercises     Shoulder Instructions Shoulder Instructions Donning/doffing shirt without moving shoulder: Caregiver independent with task Method for sponge bathing under operated UE: Caregiver independent with task Donning/doffing sling/immobilizer: Caregiver independent with task Correct positioning of sling/immobilizer: Caregiver independent with task Pendulum exercises (written home exercise program): Caregiver independent with task ROM for elbow, wrist and digits of operated UE: Caregiver independent with task Sling wearing schedule (on at all times/off for ADL's): Caregiver independent with task Proper positioning of operated UE when showering: Caregiver independent with task Dressing change: Caregiver independent with task Positioning of UE while sleeping: Caregiver independent with task    Home Living                                          Prior Functioning/Environment                          OT Problem List: Impaired UE functional use;Obesity;Decreased strength;Decreased range of motion;Pain      OT Treatment/Interventions:      OT Goals(Current goals can be found in the care plan section) Acute Rehab OT Goals OT Goal Formulation: All assessment and education complete, DC therapy  OT Frequency:      Co-evaluation              AM-PAC OT "6 Clicks" Daily Activity     Outcome Measure Help from another person eating meals?: A Little Help from another person taking care of personal grooming?: None Help from another person toileting, which includes using toliet, bedpan, or  urinal?: A Little Help from another person bathing (including washing, rinsing, drying)?: A Little Help from another person to put on and taking off regular upper body clothing?: A Lot Help from another person to put on and taking off regular lower body clothing?: A Little 6 Click Score: 18   End of Session Nurse Communication:  (OT education complete)  Activity Tolerance: Patient tolerated treatment well Patient left: in chair;with family/visitor present  OT Visit Diagnosis: Pain                Time: 8242-3536 OT Time Calculation (min): 18 min Charges:  OT General Charges $OT Visit: 1 Visit OT Evaluation $OT Eval Low Complexity: 1 Low  Lenford Beddow, OTR/L Acute Care Rehab Services  Office [phone] Pager: [phone]   Kelli Churn 10/26/2021, 12:37 PM

## 2021-10-26 NOTE — Discharge Instructions (Addendum)

## 2021-10-26 NOTE — Op Note (Signed)
10/26/2021  9:35 AM  PATIENT:   Kenneth [name]  51 y.o. male  PRE-OPERATIVE DIAGNOSIS:  right shoulder osteoarthritis  POST-OPERATIVE DIAGNOSIS: Same  PROCEDURE: Right shoulder stemless anatomic arthroplasty utilizing a size 43 trunnion, 45 x 19 humeral head, large cage screw, medium glenoid  SURGEON:  Aeva Posey, Vania Rea M.D.  ASSISTANTS: Jinger Neighbors, PA-C  ANESTHESIA:   General endotracheal and interscalene block with Exparel  EBL: 150 cc  SPECIMEN: None  Drains: None   PATIENT DISPOSITION:  PACU - hemodynamically stable.    PLAN OF CARE: Discharge to home after PACU  Brief history:  [name] is a 51 year old gentleman who has been followed for chronic and progressively increasing right shoulder pain related to severe osteoarthritis.  Due to his increasing functional limitations and failure to respond to prolonged attempts at conservative management, he is brought to the operating room at this time for planned right shoulder anatomic arthroplasty.  Preoperatively, I counseled the patient regarding treatment options and risks versus benefits thereof.  Possible surgical complications were all reviewed including potential for bleeding, infection, neurovascular injury, persistent pain, loss of motion, anesthetic complication, failure of the implant, and possible need for additional surgery. They understand and accept and agrees with our planned procedure.   Procedure detail:  After undergoing routine preop evaluation patient received prophylactic antibiotics and interscalene block with Exparel was established in the holding area by the anesthesia department.  Patient subsequently placed supine on the operating table and underwent the smooth induction of a general endotracheal anesthesia.  Placed into the beachchair position and appropriately padded and protected.  The right shoulder girdle region was sterilely prepped and draped in standard fashion.  Timeout was called.  A  deltopectoral approach to the right shoulder is made through an approximately 10 cm incision.  Skin flaps were elevated dissection carried deeply deltopectoral interval was developed from proximal to distal with a vein taken laterally.  The upper centimeter of the pectoralis major was tenotomized for exposure of the conjoined tendon was mobilized and retracted medially.  The long head biceps tendon was then tenodesed at the upper border of the pectoralis major tendon with the proximal segment unroofed and excised.  The rotator cuff was then split from the apex of the bicipital groove to the base of the coracoid and the insertion of the subscapularis was then identified both superiorly and inferiorly on its lesser tuberosity insertion and an oscillating saw was then used to perform a lesser tuberosity osteotomy removing a thin wafer of bone.  Subscapularis was tagged mobilized reflected medially.  Capsular attachments were then divided from the anterior and inferior margins of the humeral neck allowing delivery of the humeral head through the wound.  An extra medullary guide was then used to outline the posterior head resection which we performed with an oscillating saw at approximately 30 degrees retroversion.  Peripheral ossified fluid removed with a rondure in the metaphysis was then sized at a 43.  Preparation performed with the coring reamer and measured for a large cage screw.  At this point a metal cap was then placed over the proximal humeral surface and the glenoid was then exposed with appropriate retractors and a circumferential labral resection was then performed getting complete visualization of the glenoid.  It was sized to the medium and a central guidepin was then placed.  The glenoid was then reamed with the medium reamer to a stable subchondral bony bed.  All bony and soft tissue debris was then carefully  removed.  Preparation completed with a central drill followed by the placement of the superior  and inferior peg and Maira Christon respectively and the gland was then broached and trialed glenoid showed excellent fit and fixation.  At this point the cement was mixed introduced into the superior and inferior peg and slot respectively.  Glenoid was prepared with morselized bone graft impacted around the central peg and the glenoid was then seated with excellent purchase and fixation.  We returned our attention back to the proximal humerus where solid bone was confirmed we opened a 43 trunnion which was placed using the trunnion guide and a large cage screw was then packed with bone graft and the cage screw was inserted achieving excellent purchase and fixation.  A series of trial reduction was then performed and ultimately the 45 x 19 head gave [place] the best motion stability and soft tissue balance with approximately 50% translation across the glenoid and good soft tissue tension.  The trial was then removed.  We placed 2 Medial Row anchors for the future subscap repair and a suture limb was passed through the bilateral color of the trunnion giving [place] 3 medial row fixation points.  At this point the trunnion was cleaned and dried and the final 45 x 19 head was impacted.  Final reduction was performed again showing excellent motion and stability and soft tissue balance all much to our satisfaction.  We then mobilized subscapularis and confirmed good elasticity and excursion.  Our medial row suture limbs were then passed through the bone tendon junction.  These were then passed in alternating fashion into 2 lateral anchors into the bicipital groove creating a double row repair with excellent stability and fixation much to our satisfaction.  The rotator interval was repaired with a series of figure-of-eight suture tape sutures.  Upon completion arm easily achieved 45 degrees of external rotation without excessive tension of the subscapularis.  Final irrigation was completed.  Hemostasis was obtained.  Vancomycin powder was  then spread liberally throughout the deep soft tissue planes.  The deltopectoral interval was reapproximated with a series of figure-of-eight #0 Vicryl sutures.  2-0 Monocryl used to close subcu layer and intracuticular 3-0 Monocryl for the skin followed by Dermabond and Aquacel dressing.  The right arm was placed into a sling and the patient was awakened, extubated, and taken to the recovery in stable condition.  Ralene Bathe, PA-C was utilized as an Geophysicist/field seismologist throughout this case, essential for help with positioning the patient, positioning extremity, tissue manipulation, implantation of the prosthesis, suture management, wound closure, and intraoperative decision-making.  Senaida Lange MD   Contact # [phone]

## 2021-10-26 NOTE — Anesthesia Procedure Notes (Addendum)
Procedure Name: Intubation Date/Time: 10/26/2021 7:43 AM Performed by: Gerald Leitz, CRNA Pre-anesthesia Checklist: Patient identified, Patient being monitored, Timeout performed, Emergency Drugs available and Suction available Patient Re-evaluated:Patient Re-evaluated prior to induction Oxygen Delivery Method: Circle system utilized Preoxygenation: Pre-oxygenation with 100% oxygen Induction Type: IV induction Ventilation: Mask ventilation without difficulty Laryngoscope Size: Mac and 4 Grade View: Grade II Tube type: Oral Tube size: 7.5 mm Number of attempts: 2 (DL x 1, Glidescope successful x 1) Airway Equipment and Method: Stylet Placement Confirmation: ETT inserted through vocal cords under direct vision, positive ETCO2 and breath sounds checked- equal and bilateral Secured at: 23 cm Tube secured with: Tape Dental Injury: Teeth and Oropharynx as per pre-operative assessment  Difficulty Due To: Difficult Airway- due to anterior larynx Comments: Glide scope recommended

## 2021-10-27 ENCOUNTER — Encounter (HOSPITAL_COMMUNITY): Payer: Self-pay | Admitting: Orthopedic Surgery

## 2022-01-24 ENCOUNTER — Ambulatory Visit
Admission: EM | Admit: 2022-01-24 | Discharge: 2022-01-24 | Disposition: A | Discharge status: Home / Self Care | Payer: PRIVATE HEALTH INSURANCE | Attending: Nurse Practitioner | Admitting: Nurse Practitioner

## 2022-01-24 DIAGNOSIS — H60332 Swimmer's ear, left ear: Secondary | ICD-10-CM

## 2022-01-24 MED ORDER — OFLOXACIN 0.3 % OT SOLN: 10.0000 [drp] | Freq: Every day | OTIC | 0 refills | Status: AC

## 2022-01-24 NOTE — ED Provider Notes (Signed)
[facility]    CSN: [id number] Arrival date & time: 01/24/22  1214      History   Chief Complaint Chief Complaint  Patient presents with   Otalgia    HPI Kenneth [name] is a 51 y.o. male.   Patient presents with left ear pain that has been present for the past 2 to 3 days.  He denies any drainage from the ear, fevers, cough, sore throat, or congestion.  He reports the pain is severe and sharp.  Denies any drainage from the ear, itching.  Does report that hearing is muffled from the left ear.  Denies use of Q-tips.  Reports has tried ibuprofen, topical lidocaine without much relief.  Reports he got a pool this year and has been swimming a lot this year.    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 Authorizing Provider  ofloxacin (FLOXIN) 0.3 % OTIC solution Place 10 drops into the left ear daily for 7 days. 01/24/22 01/31/22 Yes Valentino Nose, NP  albuterol (VOSPIRE ER) 8 MG 12 hr tablet Take 8 mg by mouth as needed.    [provider]  Aspirin-Acetaminophen-Caffeine (GOODYS EXTRA STRENGTH) [phone] MG PACK Take 1 packet by mouth daily as needed (pain).    [provider]  cyclobenzaprine (FLEXERIL) 10 MG tablet Take 1 tablet (10 mg total) by mouth 3 (three) times daily as needed for muscle spasms. 10/26/21   Shuford, French Ana, PA-C  doxycycline (VIBRA-TABS) 100 MG tablet  Take 100 mg by mouth 2 (two) times daily.    [provider]  ibuprofen (ADVIL) 800 MG tablet Take 1 tablet (800 mg total) by mouth every 8 (eight) hours as needed for moderate pain. 10/26/21   Shuford, French Ana, PA-C  Multiple Vitamins-Minerals (AIRBORNE GUMMIES) CHEW Chew 2 each by mouth at bedtime.    [provider]  ondansetron (ZOFRAN) 4 MG tablet Take 1 tablet (4 mg total) by mouth every 8 (eight) hours as needed for nausea or vomiting. 10/26/21   Shuford, French Ana, PA-C  oxyCODONE-acetaminophen (PERCOCET) 5-325 MG tablet Take 1 tablet by mouth every 4 (four) hours as needed (max 6 q). 10/26/21   Shuford, French Ana, PA-C    Family History History reviewed. No pertinent family history.  Social History Social History   Tobacco Use   Smoking status: Former   Smokeless tobacco: Never  Building services engineer Use: Never used  Substance Use Topics   Alcohol use: Yes    Comment: occas.   Drug use: No     Allergies   Penicillins   Review of Systems Review of Systems Per HPI  Physical Exam Triage Vital Signs ED Triage Vitals [01/24/22 1227]  Enc Vitals Group     BP (!) 134/96     Pulse Rate 92  Resp 18     Temp 98.6 F (37 C)     Temp Source Oral     SpO2 97 %     Weight      Height      Head Circumference      Peak Flow      Pain Score 8     Pain Loc      Pain Edu?      Excl. in GC?    No data found.  Updated Vital Signs BP (!) 134/96 (BP Location: Right Arm)   Pulse 92   Temp 98.6 F (37 C) (Oral)   Resp 18   SpO2 97%   Visual Acuity Right Eye Distance:   Left Eye Distance:   Bilateral Distance:    Right Eye Near:   Left Eye Near:    Bilateral Near:     Physical Exam Vitals and nursing note reviewed.  Constitutional:      General: He is not in acute distress.    Appearance: Normal appearance. He is not toxic-appearing.  HENT:     Head: Normocephalic and atraumatic.     Right Ear: Hearing, tympanic membrane, ear canal and external ear  normal. No decreased hearing noted. No laceration, drainage or swelling. No middle ear effusion. There is no impacted cerumen. Tympanic membrane is not injected, perforated, erythematous, retracted or bulging.     Left Ear: Decreased hearing noted. Swelling and tenderness present. No drainage.  No middle ear effusion. There is no impacted cerumen. Tympanic membrane is not injected, perforated, erythematous, retracted or bulging.     Ears:     Comments: Left pinna tender with manipulation    Nose: Nose normal. No congestion.     Mouth/Throat:     Mouth: Mucous membranes are moist.     Pharynx: Oropharynx is clear.  Pulmonary:     Effort: Pulmonary effort is normal. No respiratory distress.  Skin:    General: Skin is warm and dry.     Coloration: Skin is not jaundiced or pale.     Findings: No erythema.  Neurological:     Mental Status: He is alert and oriented to person, place, and time.  Psychiatric:        Behavior: Behavior is cooperative.      UC Treatments / Results  Labs (all labs ordered are listed, but only abnormal results are displayed) Labs Reviewed - No data to display  EKG   Radiology No results found.  Procedures Procedures (including critical care time)  Medications Ordered in UC Medications - No data to display  Initial Impression / Assessment and Plan / UC Course  I have reviewed the triage vital signs and the nursing notes.  Pertinent labs & imaging results that were available during my care of the patient were reviewed by me and considered in my medical decision making (see chart for details).    Patient is a very pleasant, well-appearing 51 year old male presenting for left ear pain today.  In triage, he is normotensive, not tachycardic, not tachypneic, and oxygenating well on room air.  Additionally, he is afebrile.  Examination consistent with otitis externa.  Treat with ofloxacin eardrops daily for 7 days.  Ear precautions discussed.  Follow-up with  PCP if hearing does not improve after treatment.  The patient was given the opportunity to ask questions.  All questions answered to their satisfaction.  The patient is in agreement to this plan.   Final Clinical Impressions(s) / UC  Diagnoses   Final diagnoses:  Acute swimmer's ear of left side     Discharge Instructions      - You have an ear infection in the outside of your left ear - Please start the eardrops and take them as prescribed -Avoid getting any water in your ear until this heals all the way -If the hearing does not improve despite treatment and after the pain has gone, please follow-up with your primary care provider    ED Prescriptions     Medication Sig Dispense Auth. Provider   ofloxacin (FLOXIN) 0.3 % OTIC solution Place 10 drops into the left ear daily for 7 days. 5 mL Valentino Nose, NP      PDMP not reviewed this encounter.   Valentino Nose, NP 01/24/22 1244

## 2022-01-24 NOTE — ED Triage Notes (Signed)
Pt reports left ear pain x 2-3 days,. Ibuprofen gives some rleief.

## 2022-01-24 NOTE — Discharge Instructions (Addendum)
-   You have an ear infection in the outside of your left ear - Please start the eardrops and take them as prescribed -Avoid getting any water in your ear until this heals all the way -If the hearing does not improve despite treatment and after the pain has gone, please follow-up with your primary care provider

## 2022-01-26 ENCOUNTER — Ambulatory Visit
Admission: EM | Admit: 2022-01-26 | Discharge: 2022-01-26 | Disposition: A | Discharge status: Home / Self Care | Payer: PRIVATE HEALTH INSURANCE | Attending: Family Medicine | Admitting: Family Medicine

## 2022-01-26 ENCOUNTER — Encounter: Payer: Self-pay | Admitting: Emergency Medicine

## 2022-01-26 DIAGNOSIS — H60392 Other infective otitis externa, left ear: Secondary | ICD-10-CM

## 2022-01-26 MED ORDER — DOXYCYCLINE HYCLATE 100 MG PO TABS: 100.0000 mg | ORAL_TABLET | Freq: Two times a day (BID) | ORAL | 0 refills | Status: AC

## 2022-01-26 NOTE — ED Provider Notes (Signed)
[facility]    CSN: [id number] Arrival date & time: 01/26/22  0804      History   Chief Complaint No chief complaint on file.   HPI Kenneth [name] is a 51 y.o. male.   Presenting today with progressively worsening left outer ear pain, swelling, muffled hearing.  He was seen 2 days ago for the same and given ofloxacin drops which she states has seemed to make it worse and not better.  He states he is barely able to eat now as opening and closing his mouth makes his ear hurt so bad.  He did have some low-grade fevers last night he thinks.  He denies drainage, headache, injury to the ear, recent illness including upper respiratory symptoms.  Trying over-the-counter pain relievers with minimal relief.    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 Authorizing Provider  albuterol (VOSPIRE ER) 8 MG 12 hr tablet Take 8 mg by mouth as needed.    [provider]  Aspirin-Acetaminophen-Caffeine (GOODYS EXTRA STRENGTH) [phone] MG PACK Take 1 packet by mouth daily as needed (pain).    [provider]  cyclobenzaprine (FLEXERIL) 10 MG tablet Take 1 tablet (10 mg total) by mouth 3 (three) times daily as needed for muscle spasms. 10/26/21   Shuford, French Ana, PA-C  doxycycline (VIBRA-TABS) 100 MG tablet Take 1 tablet (100 mg total) by mouth 2 (two) times daily. 01/26/22   Particia Nearing,  PA-C  ibuprofen (ADVIL) 800 MG tablet Take 1 tablet (800 mg total) by mouth every 8 (eight) hours as needed for moderate pain. 10/26/21   Shuford, French Ana, PA-C  Multiple Vitamins-Minerals (AIRBORNE GUMMIES) CHEW Chew 2 each by mouth at bedtime.    [provider]  ofloxacin (FLOXIN) 0.3 % OTIC solution Place 10 drops into the left ear daily for 7 days. 01/24/22 01/31/22  Valentino Nose, NP  ondansetron (ZOFRAN) 4 MG tablet Take 1 tablet (4 mg total) by mouth every 8 (eight) hours as needed for nausea or vomiting. 10/26/21   Shuford, French Ana, PA-C  oxyCODONE-acetaminophen (PERCOCET) 5-325 MG tablet Take 1 tablet by mouth every 4 (four) hours as needed (max 6 q). 10/26/21   Shuford, French Ana, PA-C    Family History History reviewed. No pertinent family history.  Social History Social History   Tobacco Use   Smoking status: Former   Smokeless tobacco: Never  Building services engineer Use: Never used  Substance Use Topics   Alcohol use: Yes    Comment: occas.   Drug use: No     Allergies   Patient has no active allergies.   Review of Systems Review of Systems Per HPI  Physical Exam Triage Vital Signs ED Triage Vitals [01/26/22 0816]  Enc Vitals Group     BP (!) 159/106  Pulse Rate 74     Resp 18     Temp 98.1 F (36.7 C)     Temp Source Oral     SpO2 98 %     Weight      Height      Head Circumference      Peak Flow      Pain Score 8     Pain Loc      Pain Edu?      Excl. in [place]?    No data found.  Updated Vital Signs BP (!) 159/106 (BP Location: Right Arm)   Pulse 74   Temp 98.1 F (36.7 C) (Oral)   Resp 18   SpO2 98%   Visual Acuity Right Eye Distance:   Left Eye Distance:   Bilateral Distance:    Right Eye Near:   Left Eye Near:    Bilateral Near:     Physical Exam Vitals and nursing note reviewed.  Constitutional:      Appearance: Normal appearance.  HENT:     Head: Atraumatic.     Right Ear: Tympanic membrane normal.     Left Ear:  Tympanic membrane normal.     Ears:     Comments: Left EAC significantly erythematous, edematous, tender to palpation.  Barely able to tolerate otoscope exam.    Nose: Nose normal.     Mouth/Throat:     Mouth: Mucous membranes are moist.     Pharynx: Oropharynx is clear.  Eyes:     Extraocular Movements: Extraocular movements intact.     Conjunctiva/sclera: Conjunctivae normal.  Cardiovascular:     Rate and Rhythm: Normal rate and regular rhythm.  Pulmonary:     Effort: Pulmonary effort is normal.     Breath sounds: Normal breath sounds.  Musculoskeletal:        General: Normal range of motion.     Cervical back: Normal range of motion and neck supple.  Skin:    General: Skin is warm and dry.  Neurological:     General: No focal deficit present.     Mental Status: He is oriented to person, place, and time.  Psychiatric:        Mood and Affect: Mood normal.        Thought Content: Thought content normal.        Judgment: Judgment normal.      UC Treatments / Results  Labs (all labs ordered are listed, but only abnormal results are displayed) Labs Reviewed - No data to display  EKG   Radiology No results found.  Procedures Procedures (including critical care time)  Medications Ordered in UC Medications - No data to display  Initial Impression / Assessment and Plan / UC Course  I have reviewed the triage vital signs and the nursing notes.  Pertinent labs & imaging results that were available during my care of the patient were reviewed by me and considered in my medical decision making (see chart for details).     We will advance therapy to oral antibiotics, doxycycline course sent, discussed over-the-counter pain relievers and close follow-up for worsening symptoms.  Final Clinical Impressions(s) / UC Diagnoses   Final diagnoses:  Infective otitis externa of left ear   Discharge Instructions   None    ED Prescriptions     Medication Sig Dispense Auth.  Provider   doxycycline (VIBRA-TABS) 100 MG tablet Take 1 tablet (100 mg total) by mouth 2 (two) times daily. 20 tablet Maurice March,  Salley Hews, PA-C      PDMP not reviewed this encounter.   Roosvelt Maser [place], [place] 01/26/22 [phone]

## 2022-01-26 NOTE — ED Triage Notes (Signed)
Left ear injection.  Was seen here 2 days ago and states ear is worse.

## 2022-04-12 ENCOUNTER — Other Ambulatory Visit (HOSPITAL_COMMUNITY): Payer: Self-pay | Admitting: Orthopedic Surgery

## 2022-04-12 DIAGNOSIS — Z96612 Presence of left artificial shoulder joint: Secondary | ICD-10-CM

## 2022-04-12 DIAGNOSIS — Z96611 Presence of right artificial shoulder joint: Secondary | ICD-10-CM

## 2022-04-20 ENCOUNTER — Encounter (HOSPITAL_COMMUNITY)
Admission: RE | Admit: 2022-04-20 | Discharge: 2022-04-20 | Disposition: A | Discharge status: Home / Self Care | Payer: Worker's Compensation | Source: Ambulatory Visit | Attending: Orthopedic Surgery | Admitting: Orthopedic Surgery

## 2022-04-20 ENCOUNTER — Ambulatory Visit (HOSPITAL_COMMUNITY)
Admission: RE | Admit: 2022-04-20 | Discharge: 2022-04-20 | Disposition: A | Discharge status: Home / Self Care | Payer: Worker's Compensation | Source: Ambulatory Visit | Attending: Orthopedic Surgery | Admitting: Orthopedic Surgery

## 2022-04-20 DIAGNOSIS — Z96611 Presence of right artificial shoulder joint: Secondary | ICD-10-CM | POA: Diagnosis present

## 2022-04-20 DIAGNOSIS — Z96612 Presence of left artificial shoulder joint: Secondary | ICD-10-CM | POA: Insufficient documentation

## 2022-04-20 MED ORDER — TECHNETIUM TC 99M MEDRONATE IV KIT: 20.0000 | PACK | Freq: Once | INTRAVENOUS | Status: DC | PRN

## 2022-07-05 ENCOUNTER — Ambulatory Visit
Admission: EM | Admit: 2022-07-05 | Discharge: 2022-07-05 | Disposition: A | Discharge status: Home / Self Care | Payer: PRIVATE HEALTH INSURANCE | Attending: Internal Medicine | Admitting: Internal Medicine

## 2022-07-05 DIAGNOSIS — Z23 Encounter for immunization: Secondary | ICD-10-CM | POA: Diagnosis not present

## 2022-07-05 DIAGNOSIS — S61412A Laceration without foreign body of left hand, initial encounter: Secondary | ICD-10-CM

## 2022-07-05 MED ORDER — TETANUS-DIPHTH-ACELL PERTUSSIS 5-2.5-18.5 LF-MCG/0.5 IM SUSY
0.5000 mL | PREFILLED_SYRINGE | Freq: Once | INTRAMUSCULAR | Status: AC
  Administered 2022-07-05: 0.5 mL via INTRAMUSCULAR

## 2022-07-05 NOTE — Discharge Instructions (Signed)
You have 5 sutures to close your wound.  Return in 7 days to have sutures removed.  Monitor for any increased redness, swelling, pus and follow-up sooner if this occurs.

## 2022-07-05 NOTE — ED Provider Notes (Signed)
[facility]    CSN: [id number] Arrival date & time: 07/05/22  1137      History   Chief Complaint Chief Complaint  Patient presents with   Laceration    HPI Kenneth [name] is a 52 y.o. male.   Patient presents with laceration to dorsal surface of left hand that occurred directly prior to arrival to urgent care.  Patient reports that he accidentally cut it with his pocket knife.  He states that it was not a puncture wound but that it was sliced across.  Patient not sure of last tetanus vaccine.  He does not take any blood thinning medications.  Denies numbness or tingling.   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 Authorizing Provider  albuterol (VOSPIRE ER) 8 MG 12 hr tablet Take 8 mg by mouth as needed.    [provider]  Aspirin-Acetaminophen-Caffeine (GOODYS EXTRA STRENGTH) [phone] MG PACK Take 1 packet by mouth daily as needed (pain).    [provider]  cyclobenzaprine (FLEXERIL) 10 MG tablet Take 1 tablet (10 mg total) by mouth 3 (three) times daily as needed for muscle spasms. 10/26/21   Shuford, Olivia Mackie, PA-C  doxycycline (VIBRA-TABS) 100 MG tablet Take 1 tablet (100 mg total) by mouth 2 (two) times daily. 01/26/22   Volney American, PA-C  ibuprofen (ADVIL) 800 MG tablet Take 1 tablet (800 mg total) by mouth every 8 (eight) hours as needed for  moderate pain. 10/26/21   Shuford, Olivia Mackie, PA-C  Multiple Vitamins-Minerals (AIRBORNE GUMMIES) CHEW Chew 2 each by mouth at bedtime.    [provider]  ondansetron (ZOFRAN) 4 MG tablet Take 1 tablet (4 mg total) by mouth every 8 (eight) hours as needed for nausea or vomiting. 10/26/21   Shuford, Olivia Mackie, PA-C  oxyCODONE-acetaminophen (PERCOCET) 5-325 MG tablet Take 1 tablet by mouth every 4 (four) hours as needed (max 6 q). 10/26/21   Shuford, Olivia Mackie, PA-C    Family History History reviewed. No pertinent family history.  Social History Social History   Tobacco Use   Smoking status: Former   Smokeless tobacco: Never  Scientific laboratory technician Use: Never used  Substance Use Topics   Alcohol use: Yes    Comment: occas.   Drug use: No     Allergies   Patient has no known allergies.   Review of Systems Review of Systems Per HPI  Physical Exam Triage Vital Signs ED Triage Vitals  Enc Vitals Group     BP 07/05/22 1147 (!) 145/110     Pulse Rate 07/05/22 1147 (!) 112     Resp 07/05/22 1147 16     Temp 07/05/22 1147 98.7 F (37.1 C)     Temp Source 07/05/22 1147 Oral     SpO2 07/05/22 1147  96 %     Weight --      Height --      Head Circumference --      Peak Flow --      Pain Score 07/05/22 1148 0     Pain Loc --      Pain Edu? --      Excl. in [place]? --    No data found.  Updated Vital Signs BP 114/77 (BP Location: Right Arm)   Pulse (!) 102   Temp 98.7 F (37.1 C) (Oral)   Resp 16   SpO2 97%   Visual Acuity Right Eye Distance:   Left Eye Distance:   Bilateral Distance:    Right Eye Near:   Left Eye Near:    Bilateral Near:     Physical Exam Constitutional:      General: He is not in acute distress.    Appearance: Normal appearance. He is not toxic-appearing or diaphoretic.  HENT:     Head: Normocephalic and atraumatic.  Eyes:     Extraocular Movements: Extraocular movements intact.     Conjunctiva/sclera: Conjunctivae normal.  Pulmonary:      Effort: Pulmonary effort is normal.  Musculoskeletal:     Comments: Patient has full range of motion of hand and fingers.  Neurovascular intact.  Skin:    Comments: Patient has approximately 1 inch linear superficial laceration present to dorsal surface of left hand between first and second digit.  Bleeding controlled.  No tendons noted.  Neurological:     General: No focal deficit present.     Mental Status: He is alert and oriented to person, place, and time. Mental status is at baseline.  Psychiatric:        Mood and Affect: Mood normal.        Behavior: Behavior normal.        Thought Content: Thought content normal.        Judgment: Judgment normal.      UC Treatments / Results  Labs (all labs ordered are listed, but only abnormal results are displayed) Labs Reviewed - No data to display  EKG   Radiology No results found.  Procedures Laceration Repair  Date/Time: 07/05/2022 12:58 PM  Performed by: Teodora Medici, FNP Authorized by: Teodora Medici, FNP   Consent:    Consent obtained:  Verbal   Consent given by:  Patient   Risks, benefits, and alternatives were discussed: yes     Risks discussed:  Infection and pain   Alternatives discussed:  No treatment and delayed treatment Universal protocol:    Procedure explained and questions answered to patient or proxy's satisfaction: yes     Immediately prior to procedure, a time out was called: yes     Patient identity confirmed:  Verbally with patient and arm band Anesthesia:    Anesthesia method:  Local infiltration   Local anesthetic:  Lidocaine 1% w/o epi Laceration details:    Location:  Hand   Hand location:  L hand, dorsum   Length (cm):  2.5 Pre-procedure details:    Preparation:  Patient was prepped and draped in usual sterile fashion Exploration:    Imaging outcome: foreign body not noted     Wound exploration: wound explored through full range of motion and entire depth of wound visualized      Contaminated: no   Treatment:    Area cleansed with:  Povidone-iodine   Amount of cleaning:  Standard Skin repair:  Repair method:  Sutures   Suture size:  4-0   Suture material:  Nylon   Suture technique:  Simple interrupted   Number of sutures:  5 Approximation:    Approximation:  Close Repair type:    Repair type:  Simple Post-procedure details:    Dressing:  Antibiotic ointment and non-adherent dressing   Procedure completion:  Tolerated well, no immediate complications  (including critical care time)  Medications Ordered in UC Medications  Tdap (BOOSTRIX) injection 0.5 mL (0.5 mLs Intramuscular Given 07/05/22 1252)    Initial Impression / Assessment and Plan / UC Course  I have reviewed the triage vital signs and the nursing notes.  Pertinent labs & imaging results that were available during my care of the patient were reviewed by me and considered in my medical decision making (see chart for details).     5 sutures placed with close approximation.  Patient to return in 7 days to have sutures removed.  Nonadherent dressing with antibiotic ointment placed prior to discharge.  Advised supportive care.  Patient advised to follow-up if any signs of infection occur.  He voiced understanding.  Patient's heart rate and blood pressure was elevated when he arrived but he reported that he was very anxious due to laceration.  It was much improved prior to discharge.  Therefore, do not think any further workup is necessary for this.  Patient verbalized understanding was agreeable with plan. Final Clinical Impressions(s) / UC Diagnoses   Final diagnoses:  Laceration of left hand without foreign body, initial encounter     Discharge Instructions      You have 5 sutures to close your wound.  Return in 7 days to have sutures removed.  Monitor for any increased redness, swelling, pus and follow-up sooner if this occurs.    ED Prescriptions   None    PDMP not reviewed this  encounter.   Teodora Medici, [place] 07/05/22 [phone]

## 2022-07-05 NOTE — ED Notes (Signed)
Tdap given in right deltoid due to injury of left hand.

## 2022-07-05 NOTE — ED Triage Notes (Signed)
Pt states he cut his left hand with his pocket knife. Laceration to top of left hand,bleeding controlled.

## 2022-07-13 ENCOUNTER — Ambulatory Visit
Admission: EM | Admit: 2022-07-13 | Discharge: 2022-07-13 | Disposition: A | Discharge status: Home / Self Care | Payer: PRIVATE HEALTH INSURANCE

## 2022-07-13 DIAGNOSIS — S61412D Laceration without foreign body of left hand, subsequent encounter: Secondary | ICD-10-CM

## 2022-07-13 DIAGNOSIS — Z4802 Encounter for removal of sutures: Secondary | ICD-10-CM

## 2022-07-13 NOTE — ED Triage Notes (Signed)
Pt present today to get his 5 sutures taken out of his left hand. Sutures were placed at [facility] and to be taken out 7 days after the 07/05/22.   Reports to discharge, odor, fever, or pain at the left hand.

## 2022-07-17 ENCOUNTER — Encounter: Payer: Self-pay | Admitting: *Deleted

## 2022-07-30 ENCOUNTER — Other Ambulatory Visit (HOSPITAL_BASED_OUTPATIENT_CLINIC_OR_DEPARTMENT_OTHER): Payer: Self-pay

## 2022-07-30 DIAGNOSIS — R0683 Snoring: Secondary | ICD-10-CM

## 2022-07-30 DIAGNOSIS — R0681 Apnea, not elsewhere classified: Secondary | ICD-10-CM

## 2022-08-02 NOTE — Patient Instructions (Addendum)
DUE TO COVID-19 ONLY TWO VISITORS  (aged 54 and older)  ARE ALLOWED TO COME WITH YOU AND STAY IN THE WAITING ROOM ONLY DURING PRE OP AND PROCEDURE.   **NO VISITORS ARE ALLOWED IN THE SHORT STAY AREA OR RECOVERY ROOM!!**  IF YOU WILL BE ADMITTED INTO THE HOSPITAL YOU ARE ALLOWED ONLY FOUR SUPPORT PEOPLE DURING VISITATION HOURS ONLY (7 AM -8PM)   The support person(s) must pass our screening, gel in and out, and wear a mask at all times, including in the patient's room. Patients must also wear a mask when staff or their support person are in the room. Visitors GUEST BADGE MUST BE WORN VISIBLY  One adult visitor may remain with you overnight and MUST be in the room by 8 P.M.     Your procedure is scheduled on: 08/16/22   Report to [facility] Main Entrance    Report to admitting at   7:45 AM   Call this number if you have problems the morning of surgery [phone]   Do not eat food :After Midnight.   After Midnight you may have the following liquids until _7:00_AM/  DAY OF SURGERY  Water Black Coffee (sugar ok, NO MILK/CREAM OR CREAMERS)  Tea (sugar ok, NO MILK/CREAM OR CREAMERS) regular and decaf                             Plain Jell-O (NO RED)                                           Fruit ices (not with fruit pulp, NO RED)                                     Popsicles (NO RED)                                                                  Juice: apple, WHITE grape, WHITE cranberry Sports drinks like Gatorade (NO RED)                    The day of surgery:  Drink ONE (1) Pre-Surgery Clear Ensure at 6:45 AM the morning of surgery. Drink in one sitting. Do not sip.  This drink was given to you during your hospital  pre-op appointment visit. Nothing else to drink after completing the  Pre-Surgery Clear Ensure at 7:00 AM          If you have questions, please contact your surgeon's office.      Oral Hygiene is also important to reduce your risk of infection.                                     Remember - BRUSH YOUR TEETH THE MORNING OF SURGERY WITH YOUR REGULAR TOOTHPASTE  DENTURES WILL BE REMOVED PRIOR TO SURGERY PLEASE DO NOT APPLY "Poly grip" OR ADHESIVES!!!   Do NOT smoke after Midnight  Take these medicines the morning of surgery with A SIP OF WATER: Use your inhaler and bring it with you   Bring CPAP mask and tubing day of surgery.                              You may not have any metal on your body including  jewelry, and body piercing             Do not wear  lotions, powders, cologne, or deodorant               Men may shave face and neck.   Do not bring valuables to the hospital. [place].   Contacts, glasses, or bridgework may not be worn into surgery.   Bring small overnight bag day of surgery.   DO NOT [place]. PHARMACY WILL DISPENSE MEDICATIONS LISTED ON YOUR MEDICATION LIST TO YOU DURING YOUR ADMISSION [place]!     Special Instructions: Bring a copy of your healthcare power of attorney and living will documents the day of surgery if you haven't scanned them before.              Please read over the following fact sheets you were given: IF YOU HAVE QUESTIONS ABOUT YOUR PRE-OP INSTRUCTIONS PLEASE CALL [place]- Preparing for Total Shoulder Arthroplasty    Before surgery, you can play an important role. Because skin is not sterile, your skin needs to be as free of germs as possible. You can reduce the number of germs on your skin by using the following products. Benzoyl Peroxide Gel Reduces the number of germs present on the skin Applied twice a day to shoulder area starting two days before surgery    ==================================================================  Please follow these instructions carefully:  BENZOYL PEROXIDE 5% GEL  Please do not use if you have an allergy to benzoyl peroxide.   If  your skin becomes reddened/irritated stop using the benzoyl peroxide.  Starting two days before surgery, apply as follows: Apply benzoyl peroxide in the morning and at night. Apply after taking a shower. If you are not taking a shower clean entire shoulder front, back, and side along with the armpit with a clean wet washcloth.  Place a quarter-sized dollop on your shoulder and rub in thoroughly, making sure to cover the front, back, and side of your shoulder, along with the armpit.   2 days before ____ AM   ____ PM              1 day before ____ AM   ____ PM                         Do this twice a day for two days.  (Last application is the night before surgery, AFTER using the CHG soap as described below).  Do NOT apply benzoyl peroxide gel on the day of surgery.      [place] - Preparing for Surgery Before surgery, you can play an important role.  Because skin is not sterile, your skin needs to be as free of germs as possible.  You can reduce the number of germs on your skin by washing with CHG (chlorahexidine gluconate) soap before surgery.  CHG is an  antiseptic cleaner which kills germs and bonds with the skin to continue killing germs even after washing. Please DO NOT use if you have an allergy to CHG or antibacterial soaps.  If your skin becomes reddened/irritated stop using the CHG and inform your nurse when you arrive at Short Stay.  You may shave your face/neck. Please follow these instructions carefully:  1.  Shower with CHG Soap the night before surgery and the  morning of Surgery.  2.  If you choose to wash your hair, wash your hair first as usual with your  normal  shampoo.  3.  After you shampoo, rinse your hair and body thoroughly to remove the  shampoo.                            4.  Use CHG as you would any other liquid soap.  You can apply chg directly  to the skin and wash                       Gently with a scrungie or clean washcloth.  5.  Apply the CHG Soap to your  body ONLY FROM THE NECK DOWN.   Do not use on face/ open                           Wound or open sores. Avoid contact with eyes, ears mouth and genitals (private parts).                       Wash face,  Genitals (private parts) with your normal soap.             6.  Wash thoroughly, paying special attention to the area where your surgery  will be performed.  7.  Thoroughly rinse your body with warm water from the neck down.  8.  DO NOT shower/wash with your normal soap after using and rinsing off  the CHG Soap.             9.  Pat yourself dry with a clean towel.            10.  Wear clean pajamas.            11.  Place clean sheets on your bed the night of your first shower and do not  sleep with pets. Day of Surgery : Do not apply any lotions/deodorants the morning of surgery.  Please wear clean clothes to the hospital/surgery center.  FAILURE TO FOLLOW THESE INSTRUCTIONS MAY RESULT IN THE CANCELLATION OF YOUR SURGERY   PATIENT SIGNATURE_________________________________  ________________________________________________________________________  Kenneth [name]  An incentive spirometer is a tool that can help keep your lungs clear and active. This tool measures how well you are filling your lungs with each breath. Taking long deep breaths may help reverse or decrease the chance of developing breathing (pulmonary) problems (especially infection) following: A long period of time when you are unable to move or be active. BEFORE THE PROCEDURE  If the spirometer includes an indicator to show your best effort, your nurse or respiratory therapist will set it to a desired goal. If possible, sit up straight or lean slightly forward. Try not to slouch. Hold the incentive spirometer in an upright position. INSTRUCTIONS FOR USE  Sit on the edge of your bed if possible, or sit up as far as you can  in bed or on a chair. Hold the incentive spirometer in an upright position. Breathe out  normally. Place the mouthpiece in your mouth and seal your lips tightly around it. Breathe in slowly and as deeply as possible, raising the piston or the ball toward the top of the column. Hold your breath for 3-5 seconds or for as long as possible. Allow the piston or ball to fall to the bottom of the column. Remove the mouthpiece from your mouth and breathe out normally. Rest for a few seconds and repeat Steps 1 through 7 at least 10 times every 1-2 hours when you are awake. Take your time and take a few normal breaths between deep breaths. The spirometer may include an indicator to show your best effort. Use the indicator as a goal to work toward during each repetition. After each set of 10 deep breaths, practice coughing to be sure your lungs are clear. If you have an incision (the cut made at the time of surgery), support your incision when coughing by placing a pillow or rolled up towels firmly against it. Once you are able to get out of bed, walk around indoors and cough well. You may stop using the incentive spirometer when instructed by your caregiver.  RISKS AND COMPLICATIONS Take your time so you do not get dizzy or light-headed. If you are in pain, you may need to take or ask for pain medication before doing incentive spirometry. It is harder to take a deep breath if you are having pain. AFTER USE Rest and breathe slowly and easily. It can be helpful to keep track of a log of your progress. Your caregiver can provide you with a simple table to help with this. If you are using the spirometer at home, follow these instructions: [place] IF:  You are having difficultly using the spirometer. You have trouble using the spirometer as often as instructed. Your pain medication is not giving enough relief while using the spirometer. You develop fever of 100.5 F (38.1 C) or higher. SEEK IMMEDIATE MEDICAL CARE IF:  You cough up bloody sputum that had not been present before. You  develop fever of 102 F (38.9 C) or greater. You develop worsening pain at or near the incision site. MAKE SURE YOU:  Understand these instructions. Will watch your condition. Will get help right away if you are not doing well or get worse. Document Released: 10/01/2006 Document Revised: 08/13/2011 Document Reviewed: 12/02/2006 [facility] Patient Information 2014 [place], [place].   ________________________________________________________________________

## 2022-08-03 ENCOUNTER — Other Ambulatory Visit: Payer: Self-pay

## 2022-08-03 ENCOUNTER — Encounter: Payer: Self-pay | Admitting: Emergency Medicine

## 2022-08-03 ENCOUNTER — Encounter (HOSPITAL_COMMUNITY): Payer: Self-pay

## 2022-08-03 ENCOUNTER — Encounter (HOSPITAL_COMMUNITY)
Admission: RE | Admit: 2022-08-03 | Discharge: 2022-08-03 | Disposition: A | Discharge status: Home / Self Care | Payer: Worker's Compensation | Source: Ambulatory Visit | Attending: Orthopedic Surgery | Admitting: Orthopedic Surgery

## 2022-08-03 ENCOUNTER — Ambulatory Visit
Admission: EM | Admit: 2022-08-03 | Discharge: 2022-08-03 | Disposition: A | Discharge status: Home / Self Care | Payer: PRIVATE HEALTH INSURANCE | Attending: Nurse Practitioner | Admitting: Nurse Practitioner

## 2022-08-03 DIAGNOSIS — Z01818 Encounter for other preprocedural examination: Secondary | ICD-10-CM

## 2022-08-03 DIAGNOSIS — Z01812 Encounter for preprocedural laboratory examination: Secondary | ICD-10-CM | POA: Diagnosis present

## 2022-08-03 DIAGNOSIS — I10 Essential (primary) hypertension: Secondary | ICD-10-CM | POA: Diagnosis not present

## 2022-08-03 DIAGNOSIS — J069 Acute upper respiratory infection, unspecified: Secondary | ICD-10-CM

## 2022-08-03 LAB — CBC
HCT: 42.7 % (ref 39.0–52.0)
Hemoglobin: 14 g/dL (ref 13.0–17.0)
MCH: 29.2 pg (ref 26.0–34.0)
MCHC: 32.8 g/dL (ref 30.0–36.0)
MCV: 89.1 fL (ref 80.0–100.0)
Platelets: 133 10*3/uL — ABNORMAL LOW (ref 150–400)
RBC: 4.79 MIL/uL (ref 4.22–5.81)
RDW: 13 % (ref 11.5–15.5)
WBC: 4.7 10*3/uL (ref 4.0–10.5)
nRBC: 0 % (ref 0.0–0.2)

## 2022-08-03 LAB — BASIC METABOLIC PANEL
Anion gap: 8 (ref 5–15)
BUN: 8 mg/dL (ref 6–20)
CO2: 23 mmol/L (ref 22–32)
Calcium: 8.9 mg/dL (ref 8.9–10.3)
Chloride: 106 mmol/L (ref 98–111)
Creatinine, Ser: 0.93 mg/dL (ref 0.61–1.24)
GFR, Estimated: >60 mL/min (ref >60)
Glucose, Bld: 185 mg/dL — ABNORMAL HIGH (ref 70–99)
Potassium: 3.4 mmol/L — ABNORMAL LOW (ref 3.5–5.1)
Sodium: 137 mmol/L (ref 135–145)

## 2022-08-03 LAB — SURGICAL PCR SCREEN
MRSA, PCR: NEGATIVE
Staphylococcus aureus: NEGATIVE

## 2022-08-03 MED ORDER — ALBUTEROL SULFATE HFA 108 (90 BASE) MCG/ACT IN AERS: 2.0000 | INHALATION_SPRAY | Freq: Four times a day (QID) | RESPIRATORY_TRACT | 0 refills | Status: AC | PRN

## 2022-08-03 MED ORDER — AMOXICILLIN-POT CLAVULANATE 875-125 MG PO TABS: 1.0000 | ORAL_TABLET | Freq: Two times a day (BID) | ORAL | 0 refills | Status: AC

## 2022-08-03 MED ORDER — PREDNISONE 20 MG PO TABS: 40.0000 mg | ORAL_TABLET | Freq: Every day | ORAL | 0 refills | Status: AC

## 2022-08-03 MED ORDER — CETIRIZINE HCL 10 MG PO TABS: 10.0000 mg | ORAL_TABLET | Freq: Every day | ORAL | 0 refills | Status: AC

## 2022-08-03 NOTE — ED Provider Notes (Signed)
[facility]    CSN: [id number] Arrival date & time: 08/03/22  [id number]      History   Chief Complaint Chief Complaint  Patient presents with   Nasal Congestion    HPI ROMANCE Kenneth [name] is a 52 y.o. male.   Kenneth Kenneth [name] history is provided by Kenneth Kenneth [name] patient.   Kenneth Kenneth [name] patient presents for complaints of postnasal drainage and cough.  Symptoms have been present for Kenneth Kenneth [name] past 6 days per Kenneth Kenneth [name] patient's report.  Patient states at Kenneth Kenneth [name] onset of his symptoms, he did have a fever, but none since.  He states that he has not had sore throat, headache, ear pain, difficulty breathing, shortness of breath, abdominal pain, nausea, vomiting, or diarrhea.  Patient states he has had intermittent wheezing, and has had to use his albuterol inhaler.  He also states that he has a history of seasonal allergies and uses a nasal spray.  Patient denies history of asthma or smoking.  He also denies any obvious known sick contacts.  Reports he has been taking over-Kenneth Kenneth [name]-counter cough and cold medications such as Mucinex and TheraFlu.  He states "I am feeling better" I am just trying to get rid of this.  Kenneth Kenneth [name] patient states he is scheduled for shoulder surgery on 08/16/2022.  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 Authorizing Provider  albuterol (VENTOLIN HFA) 108 (90 Base) MCG/ACT inhaler Inhale 2 puffs into Kenneth Kenneth [name] lungs every 6 (six) hours as needed for  wheezing or shortness of breath. 08/03/22  Yes Geraldene Eisel-Warren, CAlda Lea NP  amoxicillin-clavulanate (AUGMENTIN) 875-125 MG tablet Take 1 tablet by mouth every 12 (twelve) hours for 7 days. 08/08/22 08/15/22 Yes Babyboy Loya-Warren, CAlda Lea NP  cetirizine (ZYRTEC) 10 MG tablet Take 1 tablet (10 mg total) by mouth daily. 08/03/22  Yes Terriana Barreras-Warren, CAlda Lea NP  predniSONE (DELTASONE) 20 MG tablet Take 2 tablets (40 mg total) by mouth daily with breakfast for 5 days. 08/03/22 08/08/22 Yes Kyal Arts-Warren, CAlda Lea NP  Aspirin-Acetaminophen-Caffeine (GOODYS EXTRA STRENGTH) 57404778966MG PACK Take 1 packet by mouth daily.    [provider]  azelastine (ASTELIN) 0.1 % nasal spray Place 2 sprays into both nostrils 2 (two) times daily as needed for allergies. 06/14/22   [provider]  cyclobenzaprine (FLEXERIL) 10 MG tablet Take 1 tablet (10 mg total) by mouth 3 (three) times daily as needed for muscle spasms. Patient not taking: Reported on 08/02/2022 10/26/21   Shuford, TOlivia Mackie PA-C  diazepam (VALIUM) 10 MG tablet Take 5 mg by mouth at bedtime. 07/16/22   [provider]  doxycycline (VIBRA-TABS) 100 MG tablet Take 1 tablet (100 mg total) by mouth 2 (two) times daily. Patient not taking: Reported on 08/02/2022 01/26/22   LVolney American PA-C  ibuprofen (ADVIL) 200 MG tablet Take 400-600 mg by mouth every 6 (six)  hours as needed for moderate pain.    [provider]  lisinopril (ZESTRIL) 10 MG tablet Take 10 mg by mouth daily. 07/16/22   [provider]  Multiple Vitamins-Minerals (AIRBORNE GUMMIES) CHEW Chew 2 each by mouth at bedtime.    [provider]    Family History History reviewed. No pertinent family history.  Social History Social History   Tobacco Use   Smoking status: Former   Smokeless tobacco: Never  Scientific laboratory technician Use: Never used  Substance Use Topics   Alcohol use: Yes    Comment: occas.   Drug use: No     Allergies    Patient has no known allergies.   Review of Systems Review of Systems Per HPI  Physical Exam Triage Vital Signs ED Triage Vitals  Enc Vitals Group     BP 08/03/22 0823 (!) 142/93     Pulse Rate 08/03/22 0823 88     Resp 08/03/22 0823 18     Temp 08/03/22 0823 98.5 F (36.9 C)     Temp Source 08/03/22 0823 Oral     SpO2 08/03/22 0823 98 %     Weight --      Height --      Head Circumference --      Peak Flow --      Pain Score 08/03/22 0827 0     Pain Loc --      Pain Edu? --      Excl. in [place]? --    No data found.  Updated Vital Signs BP (!) 142/93 (BP Location: Right Arm)   Pulse 88   Temp 98.5 F (36.9 C) (Oral)   Resp 18   SpO2 98%   Visual Acuity Right Eye Distance:   Left Eye Distance:   Bilateral Distance:    Right Eye Near:   Left Eye Near:    Bilateral Near:     Physical Exam Vitals and nursing note reviewed.  Constitutional:      General: He is not in acute distress.    Appearance: Normal appearance. He is well-developed.  HENT:     Head: Normocephalic and atraumatic.     Right Ear: Tympanic membrane, ear canal and external ear normal.     Left Ear: Tympanic membrane, ear canal and external ear normal.     Nose: Congestion present.     Right Turbinates: Enlarged and swollen.     Left Turbinates: Enlarged and swollen.     Right Sinus: No maxillary sinus tenderness or frontal sinus tenderness.     Left Sinus: No maxillary sinus tenderness or frontal sinus tenderness.  Eyes:     Conjunctiva/sclera: Conjunctivae normal.     Pupils: Pupils are equal, round, and reactive to light.  Neck:     Thyroid: No thyromegaly.     Trachea: No tracheal deviation.  Cardiovascular:     Rate and Rhythm: Normal rate and regular rhythm.     Heart sounds: Normal heart sounds.  Pulmonary:     Effort: Pulmonary effort is normal.     Breath sounds: Normal breath sounds.  Abdominal:     General: Bowel sounds are normal. There is no distension.     Palpations:  Abdomen is soft.     Tenderness: There is no abdominal tenderness.  Musculoskeletal:     Cervical back: Normal range of motion and neck supple.  Skin:    General: Skin is warm and dry.  Neurological:  Mental Status: He is alert and oriented to person, place, and time.  Psychiatric:        Behavior: Behavior normal.        Thought Content: Thought content normal.        Judgment: Judgment normal.      UC Treatments / Results  Labs (all labs ordered are listed, but only abnormal results are displayed) Labs Reviewed - No data to display  EKG   Radiology No results found.  Procedures Procedures (including critical care time)  Medications Ordered in UC Medications - No data to display  Initial Impression / Assessment and Plan / UC Course  I have reviewed Kenneth Kenneth [name] triage vital signs and Kenneth Kenneth [name] nursing notes.  Pertinent labs & imaging results that were available during my care of Kenneth Kenneth [name] patient were reviewed by me and considered in my medical decision making (see chart for details).  Kenneth Kenneth [name] patient is well-appearing, he is in no acute distress, vital signs are stable.  Suspect a viral upper respiratory infection with cough.  Per Kenneth Kenneth [name] patient's report, his symptoms are improving at this time.  Patient continues to have postnasal drainage and a residual cough with intermittent wheezing.  Will provide symptomatic treatment with cetirizine 10 mg as an antihistamine, prednisone 40 mg to help with wheezing, and an albuterol inhaler for wheezing and shortness of breath.  Patient was advised that symptoms appear to be viral, however, if they do not improve within Kenneth Kenneth [name] next 3 to 5 days, an antibiotic has been sent to his pharmacy, Augmentin 875/125 mg to begin.  Supportive care recommendations were provided to Kenneth Kenneth [name] patient.  Patient is in agreement with this plan of care and verbalizes understanding.  All questions were answered.  Patient stable for discharge.  Final Clinical Impressions(s) / UC  Diagnoses   Final diagnoses:  Viral upper respiratory tract infection with cough     Discharge Instructions      Take medication as prescribed. Continue Kenneth Kenneth [name] Astelin nasal spray you are currently using. Increase fluids and allow for plenty of rest. Recommend Tylenol or ibuprofen as needed for pain, fever, or general discomfort. As discussed, if your symptoms do not improve by 08/08/22, pick up your prescription for Kenneth Kenneth [name] antibiotic at your preferred pharmacy. If your symptoms worsen prior to that date, please contact our office. Recommend using a humidifier and sleeping elevated during bedtime to help with cough and nasal congestion. If symptoms fail to improve after this treatment, please follow-up with your primary care physician for further evaluation.      ED Prescriptions     Medication Sig Dispense Auth. Provider   albuterol (VENTOLIN HFA) 108 (90 Base) MCG/ACT inhaler Inhale 2 puffs into Kenneth Kenneth [name] lungs every 6 (six) hours as needed for wheezing or shortness of breath. 8 g Tyliah Schlereth-Warren, Alda Lea, NP   predniSONE (DELTASONE) 20 MG tablet Take 2 tablets (40 mg total) by mouth daily with breakfast for 5 days. 10 tablet Chalsey Leeth-Warren, Alda Lea, NP   cetirizine (ZYRTEC) 10 MG tablet Take 1 tablet (10 mg total) by mouth daily. 30 tablet Docia Klar-Warren, Alda Lea, NP   amoxicillin-clavulanate (AUGMENTIN) 875-125 MG tablet Take 1 tablet by mouth every 12 (twelve) hours for 7 days. 14 tablet Shant Hence-Warren, Alda Lea, NP      PDMP not reviewed this encounter.   Tish Men, NP 08/03/22 [phone]

## 2022-08-03 NOTE — Progress Notes (Signed)
Anesthesia note:    PCP - Pablo Lawrence Cardiologist -none Other-   Chest x-ray - no EKG - 10/13/21-epic Stress Test - no ECHO - no Cardiac Cath - no CABG-no Pacemaker/ICD device last checked:NA  Sleep Study - no he will fave one after this surgery CPAP - no  Pt is pre diabetic-no CBG at PAT visit- Fasting Blood Sugar at home- Checks Blood Sugar _____  Blood Thinner:no Blood Thinner Instructions: Aspirin Instructions: Last Dose:  Anesthesia review: Yes /  reason:BP  Patient denies shortness of breath, fever, cough and chest pain at PAT appointment. Pt was seen today for sinus congestion. He will start prednisone. He is to call his PCP if not better and start antibiotics in a few days. His BP was elevated at the PAT visit. He hadn't taken his medication yet. I told him to decrease salt intake and monitor at home also call surgeon if he is sick.  Patient verbalized understanding of instructions that were given to them at the PAT appointment. Patient was also instructed that they will need to review over the PAT instructions again at home before surgery.yes

## 2022-08-03 NOTE — Discharge Instructions (Signed)
Take medication as prescribed. Continue the Astelin nasal spray you are currently using. Increase fluids and allow for plenty of rest. Recommend Tylenol or ibuprofen as needed for pain, fever, or general discomfort. As discussed, if your symptoms do not improve by 08/08/22, pick up your prescription for the antibiotic at your preferred pharmacy. If your symptoms worsen prior to that date, please contact our office. Recommend using a humidifier and sleeping elevated during bedtime to help with cough and nasal congestion. If symptoms fail to improve after this treatment, please follow-up with your primary care physician for further evaluation.

## 2022-08-03 NOTE — ED Triage Notes (Addendum)
Pt reports nasal congestion/drainage, cough, intermittent fever x1 week. OTC medication has helped but reports symptoms continue.

## 2022-08-15 NOTE — Anesthesia Preprocedure Evaluation (Addendum)
Anesthesia Evaluation  Patient identified by MRN, date of birth, ID band Patient awake    Reviewed: Allergy & Precautions, NPO status , Patient's Chart, lab work & pertinent test results  History of Anesthesia Complications Negative for: history of anesthetic complications  Airway Mallampati: III  TM Distance: >3 FB Neck ROM: Full   Comment: Previous grade II view with MAC 4, easy mask Dental  (+) Dental Advisory Given   Pulmonary neg shortness of breath, neg sleep apnea, neg COPD, Recent URI  (sinus congestion 3 weeks ago), Resolved, former smoker   Pulmonary exam normal breath sounds clear to auscultation       Cardiovascular hypertension (lisinopril), Pt. on medications (-) angina (-) Past MI, (-) Cardiac Stents and (-) CABG (-) dysrhythmias + Valvular Problems/Murmurs (s/p aortic valvulopasty at 52 yo)  Rhythm:Regular Rate:Normal     Neuro/Psych negative neurological ROS     GI/Hepatic negative GI ROS, Neg liver ROS,,,  Endo/Other  neg diabetes  Morbid obesity  Renal/GU negative Renal ROS     Musculoskeletal  (+) Arthritis ,    Abdominal  (+) + obese  Peds  Hematology negative hematology ROS (+)   Anesthesia Other Findings   Reproductive/Obstetrics                             Anesthesia Physical Anesthesia Plan  ASA: 3  Anesthesia Plan: General and Regional   Post-op Pain Management: Regional block* and Tylenol PO (pre-op)*   Induction: Intravenous  PONV Risk Score and Plan: 2 and Ondansetron, Dexamethasone and Treatment may vary due to age or medical condition  Airway Management Planned: Oral ETT  Additional Equipment:   Intra-op Plan:   Post-operative Plan: Extubation in OR  Informed Consent: I have reviewed the patients History and Physical, chart, labs and discussed the procedure including the risks, benefits and alternatives for the proposed anesthesia with the  patient or authorized representative who has indicated his/her understanding and acceptance.     Dental advisory given  Plan Discussed with: CRNA and Anesthesiologist  Anesthesia Plan Comments: (Discussed potential risks of nerve blocks including, but not limited to, infection, bleeding, nerve damage, seizures, pneumothorax, respiratory depression, and potential failure of the block. Alternatives to nerve blocks discussed. All questions answered.  Risks of general anesthesia discussed including, but not limited to, sore throat, hoarse voice, chipped/damaged teeth, injury to vocal cords, nausea and vomiting, allergic reactions, lung infection, heart attack, stroke, and death. All questions answered. )        Anesthesia Quick Evaluation

## 2022-08-16 ENCOUNTER — Encounter (HOSPITAL_COMMUNITY)
Admission: RE | Disposition: A | Discharge status: Home / Self Care | Payer: Self-pay | Source: Ambulatory Visit | Attending: Orthopedic Surgery

## 2022-08-16 ENCOUNTER — Ambulatory Visit (HOSPITAL_COMMUNITY): Payer: Worker's Compensation | Admitting: Anesthesiology

## 2022-08-16 ENCOUNTER — Ambulatory Visit (HOSPITAL_COMMUNITY)
Admission: RE | Admit: 2022-08-16 | Discharge: 2022-08-16 | Disposition: A | Discharge status: Home / Self Care | Payer: Worker's Compensation | Source: Ambulatory Visit | Attending: Orthopedic Surgery | Admitting: Orthopedic Surgery

## 2022-08-16 ENCOUNTER — Ambulatory Visit (HOSPITAL_BASED_OUTPATIENT_CLINIC_OR_DEPARTMENT_OTHER): Payer: Worker's Compensation | Admitting: Anesthesiology

## 2022-08-16 ENCOUNTER — Encounter (HOSPITAL_COMMUNITY): Payer: Self-pay | Admitting: Orthopedic Surgery

## 2022-08-16 DIAGNOSIS — M1991 Primary osteoarthritis, unspecified site: Secondary | ICD-10-CM | POA: Diagnosis not present

## 2022-08-16 DIAGNOSIS — T84098A Other mechanical complication of other internal joint prosthesis, initial encounter: Secondary | ICD-10-CM | POA: Insufficient documentation

## 2022-08-16 DIAGNOSIS — T84018A Broken internal joint prosthesis, other site, initial encounter: Secondary | ICD-10-CM

## 2022-08-16 DIAGNOSIS — Z96611 Presence of right artificial shoulder joint: Secondary | ICD-10-CM | POA: Insufficient documentation

## 2022-08-16 DIAGNOSIS — Z87891 Personal history of nicotine dependence: Secondary | ICD-10-CM

## 2022-08-16 DIAGNOSIS — I10 Essential (primary) hypertension: Secondary | ICD-10-CM | POA: Diagnosis not present

## 2022-08-16 DIAGNOSIS — Z01818 Encounter for other preprocedural examination: Secondary | ICD-10-CM

## 2022-08-16 DIAGNOSIS — Y838 Other surgical procedures as the cause of abnormal reaction of the patient, or of later complication, without mention of misadventure at the time of the procedure: Secondary | ICD-10-CM | POA: Insufficient documentation

## 2022-08-16 DIAGNOSIS — Z6841 Body Mass Index (BMI) 40.0 and over, adult: Secondary | ICD-10-CM

## 2022-08-16 DIAGNOSIS — M25311 Other instability, right shoulder: Secondary | ICD-10-CM | POA: Insufficient documentation

## 2022-08-16 DIAGNOSIS — Z79899 Other long term (current) drug therapy: Secondary | ICD-10-CM | POA: Insufficient documentation

## 2022-08-16 HISTORY — PX: TOTAL SHOULDER REVISION: SHX6130

## 2022-08-16 HISTORY — PX: SHOULDER OPEN ROTATOR CUFF REPAIR: SHX2407

## 2022-08-16 SURGERY — REVISION, TOTAL ARTHROPLASTY, SHOULDER
Anesthesia: Regional | Site: Shoulder | Laterality: Right

## 2022-08-16 MED ORDER — OXYCODONE HCL 5 MG PO TABS
5.0000 mg | ORAL_TABLET | Freq: Once | ORAL | Status: AC | PRN
  Administered 2022-08-16: 5 mg via ORAL

## 2022-08-16 MED ORDER — ORAL CARE MOUTH RINSE: 15.0000 mL | Freq: Once | OROMUCOSAL | Status: AC

## 2022-08-16 MED ORDER — PHENYLEPHRINE HCL (PRESSORS) 10 MG/ML IV SOLN
INTRAVENOUS | Status: AC
  Filled 2022-08-16: qty 1

## 2022-08-16 MED ORDER — OXYCODONE HCL 5 MG PO TABS
ORAL_TABLET | ORAL | Status: AC
  Filled 2022-08-16: qty 1

## 2022-08-16 MED ORDER — FENTANYL CITRATE (PF) 100 MCG/2ML IJ SOLN
INTRAMUSCULAR | Status: AC
  Filled 2022-08-16: qty 2

## 2022-08-16 MED ORDER — ONDANSETRON HCL 4 MG PO TABS: 4.0000 mg | ORAL_TABLET | Freq: Three times a day (TID) | ORAL | 0 refills | Status: AC | PRN

## 2022-08-16 MED ORDER — MIDAZOLAM HCL 2 MG/2ML IJ SOLN
1.0000 mg | INTRAMUSCULAR | Status: DC
  Administered 2022-08-16: 2 mg via INTRAVENOUS
  Administered 2022-08-16 (×2): 1 mg via INTRAVENOUS
  Filled 2022-08-16: qty 2

## 2022-08-16 MED ORDER — MIDAZOLAM HCL 2 MG/2ML IJ SOLN
INTRAMUSCULAR | Status: AC
  Filled 2022-08-16: qty 2

## 2022-08-16 MED ORDER — ALBUTEROL SULFATE HFA 108 (90 BASE) MCG/ACT IN AERS
INHALATION_SPRAY | RESPIRATORY_TRACT | Status: DC | PRN
  Administered 2022-08-16: 4 via RESPIRATORY_TRACT

## 2022-08-16 MED ORDER — IBUPROFEN 800 MG PO TABS: 800.0000 mg | ORAL_TABLET | Freq: Three times a day (TID) | ORAL | 0 refills | Status: AC | PRN

## 2022-08-16 MED ORDER — ROCURONIUM BROMIDE 100 MG/10ML IV SOLN
INTRAVENOUS | Status: DC | PRN
  Administered 2022-08-16: 30 mg via INTRAVENOUS
  Administered 2022-08-16: 50 mg via INTRAVENOUS

## 2022-08-16 MED ORDER — AMISULPRIDE (ANTIEMETIC) 5 MG/2ML IV SOLN: 10.0000 mg | Freq: Once | INTRAVENOUS | Status: AC | PRN

## 2022-08-16 MED ORDER — OXYCODONE-ACETAMINOPHEN 5-325 MG PO TABS: 1.0000 | ORAL_TABLET | ORAL | 0 refills | Status: AC | PRN

## 2022-08-16 MED ORDER — AMISULPRIDE (ANTIEMETIC) 5 MG/2ML IV SOLN
INTRAVENOUS | Status: AC
  Administered 2022-08-16: 10 mg via INTRAVENOUS
  Filled 2022-08-16: qty 4

## 2022-08-16 MED ORDER — PHENYLEPHRINE HCL-NACL 20-0.9 MG/250ML-% IV SOLN
INTRAVENOUS | Status: DC | PRN
  Administered 2022-08-16: 50 ug/min via INTRAVENOUS

## 2022-08-16 MED ORDER — EPHEDRINE 5 MG/ML INJ
INTRAVENOUS | Status: AC
  Filled 2022-08-16: qty 5

## 2022-08-16 MED ORDER — BUPIVACAINE LIPOSOME 1.3 % IJ SUSP
INTRAMUSCULAR | Status: DC | PRN
  Administered 2022-08-16: 10 mL via PERINEURAL

## 2022-08-16 MED ORDER — DEXAMETHASONE SODIUM PHOSPHATE 10 MG/ML IJ SOLN
INTRAMUSCULAR | Status: DC | PRN
  Administered 2022-08-16: 10 mg via INTRAVENOUS

## 2022-08-16 MED ORDER — PROPOFOL 10 MG/ML IV BOLUS
INTRAVENOUS | Status: DC | PRN
  Administered 2022-08-16: 200 mg via INTRAVENOUS

## 2022-08-16 MED ORDER — CHLORHEXIDINE GLUCONATE 0.12 % MT SOLN
15.0000 mL | Freq: Once | OROMUCOSAL | Status: AC
  Administered 2022-08-16: 15 mL via OROMUCOSAL

## 2022-08-16 MED ORDER — PHENYLEPHRINE 80 MCG/ML (10ML) SYRINGE FOR IV PUSH (FOR BLOOD PRESSURE SUPPORT)
PREFILLED_SYRINGE | INTRAVENOUS | Status: AC
  Filled 2022-08-16: qty 20

## 2022-08-16 MED ORDER — PHENYLEPHRINE HCL (PRESSORS) 10 MG/ML IV SOLN
INTRAVENOUS | Status: DC | PRN
  Administered 2022-08-16 (×5): 160 ug via INTRAVENOUS

## 2022-08-16 MED ORDER — LACTATED RINGERS IV SOLN: INTRAVENOUS | Status: DC

## 2022-08-16 MED ORDER — CYCLOBENZAPRINE HCL 10 MG PO TABS: 10.0000 mg | ORAL_TABLET | Freq: Three times a day (TID) | ORAL | 1 refills | Status: AC | PRN

## 2022-08-16 MED ORDER — OXYCODONE HCL 5 MG/5ML PO SOLN: 5.0000 mg | Freq: Once | ORAL | Status: AC | PRN

## 2022-08-16 MED ORDER — FENTANYL CITRATE PF 50 MCG/ML IJ SOSY
25.0000 ug | PREFILLED_SYRINGE | INTRAMUSCULAR | Status: DC | PRN
  Administered 2022-08-16: 50 ug via INTRAVENOUS

## 2022-08-16 MED ORDER — BUPIVACAINE HCL (PF) 0.5 % IJ SOLN
INTRAMUSCULAR | Status: DC | PRN
  Administered 2022-08-16: 10 mL via PERINEURAL

## 2022-08-16 MED ORDER — ACETAMINOPHEN 500 MG PO TABS
1000.0000 mg | ORAL_TABLET | Freq: Once | ORAL | Status: DC
  Filled 2022-08-16: qty 2

## 2022-08-16 MED ORDER — PROPOFOL 10 MG/ML IV BOLUS
INTRAVENOUS | Status: AC
  Filled 2022-08-16: qty 20

## 2022-08-16 MED ORDER — SUGAMMADEX SODIUM 500 MG/5ML IV SOLN
INTRAVENOUS | Status: DC | PRN
  Administered 2022-08-16: 400 mg via INTRAVENOUS

## 2022-08-16 MED ORDER — EPHEDRINE SULFATE (PRESSORS) 50 MG/ML IJ SOLN
INTRAMUSCULAR | Status: DC | PRN
  Administered 2022-08-16 (×2): 10 mg via INTRAVENOUS

## 2022-08-16 MED ORDER — VANCOMYCIN HCL 1000 MG IV SOLR
INTRAVENOUS | Status: DC | PRN
  Administered 2022-08-16: 1000 mg via TOPICAL

## 2022-08-16 MED ORDER — ONDANSETRON HCL 4 MG/2ML IJ SOLN
INTRAMUSCULAR | Status: DC | PRN
  Administered 2022-08-16: 4 mg via INTRAVENOUS

## 2022-08-16 MED ORDER — CEFAZOLIN SODIUM-DEXTROSE 2-4 GM/100ML-% IV SOLN
2.0000 g | INTRAVENOUS | Status: AC
  Administered 2022-08-16: 2 g via INTRAVENOUS
  Filled 2022-08-16: qty 100

## 2022-08-16 MED ORDER — TRANEXAMIC ACID-NACL 1000-0.7 MG/100ML-% IV SOLN
1000.0000 mg | INTRAVENOUS | Status: AC
  Administered 2022-08-16: 1000 mg via INTRAVENOUS
  Filled 2022-08-16: qty 100

## 2022-08-16 MED ORDER — FENTANYL CITRATE (PF) 100 MCG/2ML IJ SOLN
INTRAMUSCULAR | Status: DC | PRN
  Administered 2022-08-16: 100 ug via INTRAVENOUS

## 2022-08-16 MED ORDER — VANCOMYCIN HCL 1000 MG IV SOLR
INTRAVENOUS | Status: AC
  Filled 2022-08-16: qty 20

## 2022-08-16 MED ORDER — LIDOCAINE HCL (CARDIAC) PF 100 MG/5ML IV SOSY
PREFILLED_SYRINGE | INTRAVENOUS | Status: DC | PRN
  Administered 2022-08-16: 100 mg via INTRAVENOUS

## 2022-08-16 MED ORDER — PHENYLEPHRINE HCL-NACL 20-0.9 MG/250ML-% IV SOLN
INTRAVENOUS | Status: AC
  Filled 2022-08-16: qty 500

## 2022-08-16 MED ORDER — 0.9 % SODIUM CHLORIDE (POUR BTL) OPTIME
TOPICAL | Status: DC | PRN
  Administered 2022-08-16: 1000 mL

## 2022-08-16 MED ORDER — FENTANYL CITRATE PF 50 MCG/ML IJ SOSY
PREFILLED_SYRINGE | INTRAMUSCULAR | Status: AC
  Filled 2022-08-16: qty 2

## 2022-08-16 MED ORDER — FENTANYL CITRATE PF 50 MCG/ML IJ SOSY
50.0000 ug | PREFILLED_SYRINGE | INTRAMUSCULAR | Status: DC
  Administered 2022-08-16: 50 ug via INTRAVENOUS
  Filled 2022-08-16: qty 2

## 2022-08-16 SURGICAL SUPPLY — 95 items
ADH SKN CLS APL DERMABOND .7 (GAUZE/BANDAGES/DRESSINGS) ×1
AID PSTN UNV HD RSTRNT DISP (MISCELLANEOUS) ×1
APL SKNCLS STERI-STRIP NONHPOA (GAUZE/BANDAGES/DRESSINGS)
BAG COUNTER SPONGE SURGICOUNT (BAG) IMPLANT
BAG SPEC THK2 15X12 ZIP CLS (MISCELLANEOUS) ×1
BAG SPNG CNTER NS LX DISP (BAG) ×1
BAG ZIPLOCK 12X15 (MISCELLANEOUS) ×1 IMPLANT
BENZOIN TINCTURE PRP APPL 2/3 (GAUZE/BANDAGES/DRESSINGS) ×1 IMPLANT
BIT DRILL AR 3 (BIT) ×1
BIT DRILL AR 3 NS (BIT) IMPLANT
BLADE SAW SGTL 83.5X18.5 (BLADE) IMPLANT
BOOTIES KNEE HIGH SLOAN (MISCELLANEOUS) ×2 IMPLANT
BSPLAT GLND +2X24 MDLR (Joint) ×1 IMPLANT
COOLER ICEMAN CLASSIC (MISCELLANEOUS) IMPLANT
COVER BACK TABLE 60X90IN (DRAPES) ×1 IMPLANT
COVER SURGICAL LIGHT HANDLE (MISCELLANEOUS) ×1 IMPLANT
CUP SUT UNIV REVERS 39 NEU (Shoulder) IMPLANT
DERMABOND ADVANCED .7 DNX12 (GAUZE/BANDAGES/DRESSINGS) ×1 IMPLANT
DRAPE INCISE IOBAN 66X45 STRL (DRAPES) IMPLANT
DRAPE ORTHO SPLIT 77X108 STRL (DRAPES) ×2
DRAPE SHEET LG 3/4 BI-LAMINATE (DRAPES) ×1 IMPLANT
DRAPE SURG 17X11 SM STRL (DRAPES) ×1 IMPLANT
DRAPE SURG ORHT 6 SPLT 77X108 (DRAPES) ×2 IMPLANT
DRAPE U-SHAPE 47X51 STRL (DRAPES) ×1 IMPLANT
DRILL SURG AR 10 (DRILL) IMPLANT
DRSG ADAPTIC 3X8 NADH LF (GAUZE/BANDAGES/DRESSINGS) ×1 IMPLANT
DRSG AQUACEL AG ADV 3.5X 6 (GAUZE/BANDAGES/DRESSINGS) IMPLANT
DRSG AQUACEL AG ADV 3.5X10 (GAUZE/BANDAGES/DRESSINGS) ×1 IMPLANT
DRSG TEGADERM 8X12 (GAUZE/BANDAGES/DRESSINGS) ×1 IMPLANT
DURAPREP 26ML APPLICATOR (WOUND CARE) ×1 IMPLANT
ELECT BLADE TIP CTD 4 INCH (ELECTRODE) ×1 IMPLANT
ELECT PENCIL ROCKER SW 15FT (MISCELLANEOUS) ×1 IMPLANT
ELECT REM PT RETURN 15FT ADLT (MISCELLANEOUS) ×1 IMPLANT
FACESHIELD WRAPAROUND (MASK) ×3 IMPLANT
FACESHIELD WRAPAROUND OR TEAM (MASK) ×3 IMPLANT
GLENOID UNI REV MOD 24 +2 LAT (Joint) IMPLANT
GLENOSPHERE 39+4 LAT/24 UNI RV (Joint) IMPLANT
GLOVE BIO SURGEON STRL SZ7 (GLOVE) ×1 IMPLANT
GLOVE BIO SURGEON STRL SZ7.5 (GLOVE) ×1 IMPLANT
GLOVE BIO SURGEON STRL SZ8 (GLOVE) ×1 IMPLANT
GLOVE BIOGEL PI IND STRL 7.0 (GLOVE) ×1 IMPLANT
GLOVE BIOGEL PI IND STRL 8 (GLOVE) ×1 IMPLANT
GLOVE ECLIPSE 7.5 STRL STRAW (GLOVE) ×1 IMPLANT
GOWN STRL SURGICAL XL XLNG (GOWN DISPOSABLE) ×2 IMPLANT
INSERT HUMERAL 39/+6 (Insert) IMPLANT
KIT BASIN OR (CUSTOM PROCEDURE TRAY) ×1 IMPLANT
KIT TURNOVER KIT A (KITS) IMPLANT
MANIFOLD NEPTUNE II (INSTRUMENTS) ×1 IMPLANT
NDL MAYO 6 CRC TAPER PT (NEEDLE) ×1 IMPLANT
NDL MAYO CATGUT SZ4 TPR NDL (NEEDLE) ×1 IMPLANT
NDL TAPERED W/ NITINOL LOOP (MISCELLANEOUS) IMPLANT
NEEDLE MAYO 6 CRC TAPER PT (NEEDLE) ×1 IMPLANT
NEEDLE MAYO CATGUT SZ4 (NEEDLE) IMPLANT
NEEDLE TAPERED W/ NITINOL LOOP (MISCELLANEOUS) ×1 IMPLANT
NS IRRIG 1000ML POUR BTL (IV SOLUTION) ×1 IMPLANT
PACK SHOULDER (CUSTOM PROCEDURE TRAY) ×1 IMPLANT
PAD ARMBOARD 7.5X6 YLW CONV (MISCELLANEOUS) ×2 IMPLANT
PAD COLD SHLDR WRAP-ON (PAD) IMPLANT
PASSER SUT SWANSON 36MM LOOP (INSTRUMENTS) ×1 IMPLANT
PIN SET MODULAR GLENOID SYSTEM (PIN) IMPLANT
PROTECTOR NERVE ULNAR (MISCELLANEOUS) ×1 IMPLANT
RESTRAINT HEAD UNIVERSAL NS (MISCELLANEOUS) ×1 IMPLANT
SCREW CENTRAL MOD 30MM (Screw) IMPLANT
SCREW PERI LOCK 5.5X24 (Screw) IMPLANT
SCREW PERI LOCK 5.5X32 (Screw) IMPLANT
SCREW PERIPHERAL 5.5X20 LOCK (Screw) IMPLANT
SLING ARM FOAM STRAP LRG (SOFTGOODS) IMPLANT
SLING ARM FOAM STRAP MED (SOFTGOODS) IMPLANT
SLING ARM IMMOBILIZER LRG (SOFTGOODS) IMPLANT
SLING ARM IMMOBILIZER MED (SOFTGOODS) IMPLANT
SMARTMIX MINI TOWER (MISCELLANEOUS)
SPIKE FLUID TRANSFER (MISCELLANEOUS) ×1 IMPLANT
SPONGE T-LAP 4X18 ~~LOC~~+RFID (SPONGE) IMPLANT
STEM HUMERAL UNI REVERSE SZ10 (Stem) IMPLANT
STRIP CLOSURE SKIN 1/2X4 (GAUZE/BANDAGES/DRESSINGS) ×1 IMPLANT
SUCTION FRAZIER HANDLE 12FR (TUBING)
SUCTION TUBE FRAZIER 12FR DISP (TUBING) ×1 IMPLANT
SUT BONE WAX W31G (SUTURE) ×1 IMPLANT
SUT ETHIBOND 2 OS 4 DA (SUTURE) ×2 IMPLANT
SUT ETHIBOND NAB CT1 #1 30IN (SUTURE) ×2 IMPLANT
SUT MNCRL AB 3-0 PS2 18 (SUTURE) ×1 IMPLANT
SUT MON AB 2-0 CT1 36 (SUTURE) ×1 IMPLANT
SUT VIC AB 1 CT1 36 (SUTURE) ×1 IMPLANT
SUT VIC AB 2-0 CT1 27 (SUTURE) ×1
SUT VIC AB 2-0 CT1 TAPERPNT 27 (SUTURE) ×1 IMPLANT
SUTURE TAPE 1.3 40 TPR END (SUTURE) IMPLANT
SUTURETAPE 1.3 40 TPR END (SUTURE) ×2
SWAB COLLECTION DEVICE MRSA (MISCELLANEOUS) IMPLANT
SWAB CULTURE ESWAB REG 1ML (MISCELLANEOUS) IMPLANT
TOWEL OR 17X26 10 PK STRL BLUE (TOWEL DISPOSABLE) ×1 IMPLANT
TOWEL OR NON WOVEN STRL DISP B (DISPOSABLE) ×1 IMPLANT
TOWER SMARTMIX MINI (MISCELLANEOUS) IMPLANT
TUBE SUCTION HIGH CAP CLEAR NV (SUCTIONS) ×1 IMPLANT
WATER STERILE IRR 1000ML POUR (IV SOLUTION) ×1 IMPLANT
YANKAUER SUCT BULB TIP 10FT TU (MISCELLANEOUS) ×1 IMPLANT

## 2022-08-16 NOTE — Anesthesia Procedure Notes (Signed)
Anesthesia Regional Block: Interscalene brachial plexus block   Pre-Anesthetic Checklist: , timeout performed,  Correct Patient, Correct Site, Correct Laterality,  Correct Procedure, Correct Position, site marked,  Risks and benefits discussed,  Surgical consent,  Pre-op evaluation,  At surgeon's request and post-op pain management  Laterality: Right  Prep: chloraprep       Needles:  Injection technique: Single-shot  Needle Type: Echogenic Stimulator Needle     Needle Length: 9cm  Needle Gauge: 21     Additional Needles:   Procedures:,,,, ultrasound used (permanent image in chart),,    Narrative:  Start time: 08/16/2022 9:10 AM End time: 08/16/2022 9:14 AM Injection made incrementally with aspirations every 5 mL.  Performed by: Personally  Anesthesiologist: Nilda Simmer, MD  Additional Notes: Discussed risks and benefits of nerve block including, but not limited to, prolonged and/or permanent nerve injury involving sensory and/or motor function. Monitors were applied and a time-out was performed. The nerve and associated structures were visualized under ultrasound guidance. After negative aspiration, local anesthetic was slowly injected around the nerve. There was no evidence of high pressure during the procedure. There were no paresthesias. VSS remained stable and the patient tolerated the procedure well.

## 2022-08-16 NOTE — Transfer of Care (Signed)
Immediate Anesthesia Transfer of Care Note  Patient: Kenneth [name]  Procedure(s) Performed: Right shoulder exploration anatomic arthroplasty and possible rotator cuff repair vs Conversion to Reverse arthroplasty (Right: Shoulder) OPEN ROTATOR CUFF REPAIR (Right: Shoulder)  Patient Location: PACU  Anesthesia Type:General  Level of Consciousness: awake, alert , oriented, and patient cooperative  Airway & Oxygen Therapy: Patient Spontanous Breathing and Patient connected to face mask oxygen  Post-op Assessment: Report given to RN, Post -op Vital signs reviewed and stable, and Patient moving all extremities X 4  Post vital signs: Reviewed and stable  Last Vitals:  Vitals Value Taken Time  BP 98/62 08/16/22 1200  Temp    Pulse 84 08/16/22 1201  Resp 22 08/16/22 1201  SpO2 96 % 08/16/22 1201  Vitals shown include unvalidated device data.  Last Pain:  Vitals:   08/16/22 1159  TempSrc:   PainSc: 0-No pain      Patients Stated Pain Goal: 4 ([id number])  Complications: No notable events documented.

## 2022-08-16 NOTE — Anesthesia Postprocedure Evaluation (Signed)
Anesthesia Post Note  Patient: Kenneth [name]  Procedure(s) Performed: Right shoulder exploration anatomic arthroplasty and possible rotator cuff repair vs Conversion to Reverse arthroplasty (Right: Shoulder) OPEN ROTATOR CUFF REPAIR (Right: Shoulder)     Patient location during evaluation: PACU Anesthesia Type: Regional and General Level of consciousness: awake Pain management: pain level controlled Vital Signs Assessment: post-procedure vital signs reviewed and stable Respiratory status: spontaneous breathing, nonlabored ventilation and respiratory function stable Cardiovascular status: blood pressure returned to baseline and stable Postop Assessment: no apparent nausea or vomiting Anesthetic complications: no   No notable events documented.  Last Vitals:  Vitals:   08/16/22 1245 08/16/22 1255  BP: 94/63 113/70  Pulse: 82 84  Resp: 20 18  Temp:  36.7 C  SpO2: 96% 97%    Last Pain:  Vitals:   08/16/22 1255  TempSrc:   PainSc: 3                  Nilda Simmer

## 2022-08-16 NOTE — H&P (Signed)
[facility] Plan    Chief Complaint: Right shoulder failed anatomic arthroplasty HPI: The patient is a 52 y.o. male well-known to our practice after previous right shoulder anatomic arthroplasty performed in May 2023.  Patient initially did extremely well postoperatively.  However approximately 3 months postop patient was in therapy and had an overhead stretching maneuver which caused acutely increased right shoulder pain which has been persistent since that episode.  Patient now reports constant shoulder pain which is failed to respond to prolonged attempts of conservative management.  Subsequent ultrasound was interpreted as showing evidence for diffuse heterogeneity of the subscapularis but no obvious discrete rupture.  From a clinical perspective patient demonstrates significant pain and guarding with markedly limited mobility and global weakness.  Concern is that he has functional loss of the subscapularis with attendant instability.  Plan is for exploration and likely conversion to reverse arthroplasty.  Past Medical History:  Diagnosis Date   Aortic stenosis    Aortic stenosis    as a child   Arthritis    Hypertension       Past Surgical History:  Procedure Laterality Date   AORTIC VALVE REPAIR  1978   at age 80. no problems since   CHOLECYSTECTOMY  2008   SHOULDER ARTHROSCOPY Left 2007   SHOULDER SURGERY Right 2020   scope   TOTAL SHOULDER ARTHROPLASTY Right 10/26/2021   Procedure: TOTAL SHOULDER ARTHROPLASTY;  Surgeon: Justice Britain, MD;  Location: [facility];  Service: Orthopedics;  Laterality: Right;  164mn    History reviewed. No pertinent family history.  Social History:  reports that he quit smoking about 15 years ago. His smoking use included cigarettes. He has a 22.00 pack-year smoking history. His smokeless tobacco use includes chew. He reports current alcohol use. He reports that he does not use drugs.  BMI: Estimated body mass index is 41.04 kg/m as calculated from the  following:   Height as of this encounter: '5\' 7"'$  (1.702 m).   Weight as of this encounter: 118.8 kg.  Lab Results  Component Value Date   ALBUMIN 4.1 08/02/2017   Diabetes: Patient does not have a diagnosis of diabetes.     Smoking Status:       Medications Prior to Admission  Medication Sig Dispense Refill   albuterol (VENTOLIN HFA) 108 (90 Base) MCG/ACT inhaler Inhale 2 puffs into the lungs every 6 (six) hours as needed for wheezing or shortness of breath. 8 g 0   azelastine (ASTELIN) 0.1 % nasal spray Place 2 sprays into both nostrils 2 (two) times daily as needed for allergies.     cetirizine (ZYRTEC) 10 MG tablet Take 1 tablet (10 mg total) by mouth daily. 30 tablet 0   diazepam (VALIUM) 10 MG tablet Take 5 mg by mouth at bedtime.     ibuprofen (ADVIL) 200 MG tablet Take 400-600 mg by mouth every 6 (six) hours as needed for moderate pain.     lisinopril (ZESTRIL) 10 MG tablet Take 10 mg by mouth daily.     Multiple Vitamins-Minerals (AIRBORNE GUMMIES) CHEW Chew 2 each by mouth at bedtime.     Aspirin-Acetaminophen-Caffeine (GOODYS EXTRA STRENGTH) 500-325-65 MG PACK Take 1 packet by mouth daily.     cyclobenzaprine (FLEXERIL) 10 MG tablet Take 1 tablet (10 mg total) by mouth 3 (three) times daily as needed for muscle spasms. (Patient not taking: Reported on 08/02/2022) 30 tablet 1   doxycycline (VIBRA-TABS) 100 MG tablet Take 1 tablet (100 mg total) by mouth  2 (two) times daily. (Patient not taking: Reported on 08/02/2022) 20 tablet 0     Physical Exam: Right shoulder examination is otherwise as documented at his multiple recent office visits.  His incisions well-healed.  A suggestion of perhaps some mild atrophy of the anterior deltoid but deltoid is firing.  Negative belly press test.  Severe pain with attempts at stability examination.  Radiographs  Plain films of the right shoulder demonstrate that his anatomic prosthesis appears to be in overall good position  alignment.  Vitals  Temp:  [98.1 F (36.7 C)] 98.1 F (36.7 C) (03/14 0755) Pulse Rate:  [81] 81 (03/14 0755) Resp:  [15] 15 (03/14 0755) BP: (129)/(95) 129/95 (03/14 0755) SpO2:  [96 %] 96 % (03/14 0755) Weight:  [118.8 kg] 118.8 kg (03/14 0758)  Assessment/Plan  Impression: Right shoulder failed anatomic arthroplasty  Plan of Action: Procedure(s): Right shoulder exploration anatomic arthroplasty and possible rotator cuff repair vs Conversion to Reverse arthroplasty OPEN ROTATOR CUFF REPAIR  Ticia Virgo M Edsel Shives 08/16/2022, 9:03 AM Contact # [phone]

## 2022-08-16 NOTE — Anesthesia Procedure Notes (Signed)
Procedure Name: Intubation Date/Time: 08/16/2022 9:54 AM  Performed by: Jonna Munro, CRNAPre-anesthesia Checklist: Patient identified, Emergency Drugs available, Suction available, Patient being monitored and Timeout performed Patient Re-evaluated:Patient Re-evaluated prior to induction Oxygen Delivery Method: Circle system utilized Preoxygenation: Pre-oxygenation with 100% oxygen Induction Type: IV induction Ventilation: Mask ventilation without difficulty Laryngoscope Size: Mac and 4 Grade View: Grade II Tube type: Oral Tube size: 7.5 mm Number of attempts: 1 Airway Equipment and Method: Stylet Placement Confirmation: ETT inserted through vocal cords under direct vision, positive ETCO2, CO2 detector and breath sounds checked- equal and bilateral Secured at: 22 cm Tube secured with: Tape Dental Injury: Teeth and Oropharynx as per pre-operative assessment

## 2022-08-16 NOTE — Op Note (Signed)
08/16/2022  11:41 AM  PATIENT:   Kenneth [name]  52 y.o. male  PRE-OPERATIVE DIAGNOSIS:  Right shoulder failed anatomic arthroplasty  POST-OPERATIVE DIAGNOSIS: Same with operative finding of anterior instability  PROCEDURE: Conversion of right shoulder anatomic arthroplasty to a reverse shoulder arthroplasty lysing a press-fit size 10 Arthrex stem with a neutral metaphysis, +6 polyethylene insert, 39/+4 glenosphere and a small/+2 baseplate  SURGEON:  Harlan Vinal, Metta Clines M.D.  ASSISTANTS: Jenetta Loges, PA-C  Jenetta Loges, PA-C was utilized as an Environmental consultant throughout this case, essential for help with positioning the patient, positioning extremity, tissue manipulation, implantation of the prosthesis, suture management, wound closure, and intraoperative decision-making.  ANESTHESIA:   General endotracheal and interscalene block with Exparel  EBL: 200 cc  SPECIMEN: None  Drains: None   PATIENT DISPOSITION:  PACU - hemodynamically stable.    [name] OF CARE: Discharge to home after PACU  Brief history:  Patient is a 52 year old gentleman well-known to our practice after a previous right shoulder anatomic arthroplasty that I performed in May 2023.  He had initially done exceptionally well.  He had an unfortunate episode while performing a stretching exercise and developed the immediate onset of severe primarily anterior right shoulder pain.  His symptoms have persisted and progressively deteriorated.  Subsequent workup has not shown any obvious changes to suggest an infectious etiology.  Evaluation of the subscapularis by ultrasound did show evidence for diffuse hypoechogenic changes but no obvious disruption.  His clinical exam was concerning for failure/instability of the shoulder and due to his ongoing pain and functional limitations he is brought to the operating this time for planned exploration with anticipated conversion to a reverse shoulder arthroplasty.  Preoperatively, I  counseled the patient regarding treatment options and risks versus benefits thereof.  Possible surgical complications were all reviewed including potential for bleeding, infection, neurovascular injury, persistent pain, loss of motion, anesthetic complication, failure of the implant, and possible need for additional surgery. They understand and accept and agrees with our planned procedure.  Procedure in detail:  After undergoing routine preop evaluation the patient received prophylactic antibiotics and interscalene block with Exparel was established in the holding area by the anesthesia department.  Patient subsequently placed spine on the operating table and underwent the smooth induction of a general endotracheal anesthesia.  Placed into the beachchair position and appropriately padded protected.  Right shoulder examination under anesthesia demonstrated evidence for anterior instability.  The right shoulder girdle region was sterilely prepped and draped in standard fashion.  Timeout was called.  His previous anterior incision was then reopened for total length approximately 12 cm.  Skin flaps were elevated dissection carried deeply and electrocautery was used for hemostasis.  The deltopectoral was then identified and the vein was carefully dissected mobilized and retracted laterally with the deltoid.  Adhesions were then divided beneath the deltoid and the pectoralis major was mobilized and retracted medially.  The conjoined tendon was then identified and carefully dissected free and retracted medially.  The underlying soft tissue/muscular tenderness attachments around the proximal humerus all appear to be intact.  At the level of the bicipital groove we dissected using electrocautery from the apex of the bicipital groove to the base of the coracoid and the subscapularis was then separated from the lesser tuberosity using a peel technique and the tendon was then tagged with a pair of grasping suture tape  sutures.  We then completed the dissection medially and inferiorly along the humeral neck allowing deliver the humeral head through  the wound.  The humeral head was intact and pristine.  We separated the humeral head from the underlying trunnion and then remove the cage screw finding that it had excellent purchase.  The case screw was removed and subsequently the trunnion was elevated preserving the bulk of the metaphyseal bone.  An additional humeral head was performed with an oscillating saw at approximate 20 degrees of retroversion.  Metal cap placed over the cut proximal humeral surface and we then exposed the glenoid.  The glenoid appeared pristine as well with no obvious looseness.  I carefully dissected the soft tissue around the margin of the glenoid gaining access to the bone implant junction and an osteotome was then used to elevate the glenoid leaving behind the pegs.  Given the quality of the bone the fact that this appeared to be a mechanical soft tissue failure the residual plugs from the glenoid implant were left in place.  The glenoid was then sized and a guidepin directed into the center of the glenoid with an approximate 10 degree inferior tilt and the glenoid was then reamed with the central followed by the peripheral reamer to a stable subchondral bony bed.  Preparation completed with a drill and tap for a 30 mm lag screw.  Our baseplate was then assembled and inserted with vancomycin powder applied to the threads of the lag screw and excellent fixation was achieved.  All of the peripheral locking screw was then placed using standard technique with excellent fixation.  A 39/+4 glenosphere was then impacted onto the baseplate and the central locking screw was placed.  We then returned attention back to the humeral metaphysis and we opened the canal by hand reaming and did remove the anchors from the greater tuberosity region that had been used for the previous subscap repair.  The canal was then  broached up to a size 10 at approximately 20 degrees of retroversion.  A neutral metaphyseal reaming guide was then used.  A trial implant was then placed and this showed excellent motor stability and soft tissue balance.  Trial was then removed.  The final implant was assembled.  The canal was irrigated cleaned and dried.  Final implant was seated.  Trial reductions ultimately showed best motion stability and soft tissue balance with a +6 polyethylene insert.  The trial was then removed.  The implant was cleaned and dried and the final poly was then impacted.  Final reduction showed excellent motion stability and soft tissue balance all much to our satisfaction.  This point we then confirmed that the anterior soft tissue envelope had appropriate elasticity and is repaired back to the eyelets on the collar the implant using the previously placed suture tape sutures.  At this point copious irrigation was then completed.  Final hemostasis was obtained.  Balance of her vancomycin powder was sprayed liberally throughout the deep soft tissue planes.  The deltopectoral interval was reapproximated with a series of figure-of-eight number Vicryl sutures.  2-0 Monocryl used to close the subcu layer and intracuticular 3-0 Monocryl used to close the skin followed by Dermabond and Aquacel dressing.  The right arm was placed into a sling and the patient was awakened, extubated, and taken to the recovery in stable condition.  Marin Shutter MD    Contact # [phone]

## 2022-08-16 NOTE — Discharge Instructions (Signed)

## 2022-08-16 NOTE — Evaluation (Signed)
Occupational Therapy Evaluation Patient Details Name: Kenneth [name] MRN: [id number] DOB: Oct 17, 1970 Today's Date: 08/16/2022   History of Present Illness Patient s/p right reverse shoulder arthroplasty   Clinical Impression   Mr. breyer snuffer is a 52 year old man s/p shoulder replacement without functional use of right dominant upper extremity secondary to effects of surgery and interscalene block and shoulder precautions. Therapist provided education and instruction to patient and family in regards to exercises, precautions, positioning, donning upper extremity clothing and bathing while maintaining shoulder precautions, ice and edema management and donning/doffing sling. Patient familiar with sling and ice machine and precautions from prior shoulder surgery. Patient to follow up with MD for further therapy needs.        Recommendations for follow up therapy are one component of a multi-disciplinary discharge planning process, led by the attending physician.  Recommendations may be updated based on patient status, additional functional criteria and insurance authorization.   Follow Up Recommendations  Follow physician's recommendations for discharge plan and follow up therapies     Assistance Recommended at Discharge Intermittent Supervision/Assistance  Patient can return home with the following A little help with walking and/or transfers;Assistance with cooking/housework    Functional Status Assessment  Patient has had a recent decline in their functional status and demonstrates the ability to make significant improvements in function in a reasonable and predictable amount of time.  Equipment Recommendations  None recommended by OT    Recommendations for Other Services       Precautions / Restrictions Precautions Precautions: Shoulder Type of Shoulder Precautions: If sitting in controlled environment, ok to come out of sling to give neck a break. Please sleep in it to protect  until follow up in office.    OK to use operative arm for feeding, hygiene and ADLs.  Ok to instruct Pendulums and lap slides as exercises. Ok to use operative arm within the following parameters for ADL purposes    New ROM (8/18)  Ok for PROM, AAROM, AROM within pain tolerance and within the following ROM  ER 20  ABD 45  FE 60 Shoulder Interventions: Shoulder sling/immobilizer Required Braces or Orthoses: Sling Restrictions Weight Bearing Restrictions: Yes RUE Weight Bearing: Non weight bearing      Mobility Bed Mobility Overal bed mobility: Independent                  Transfers Overall transfer level: Independent                        Balance Overall balance assessment: No apparent balance deficits (not formally assessed)                                         ADL either performed or assessed with clinical judgement   ADL                                               Vision Patient Visual Report: No change from baseline       Perception     Praxis      Pertinent Vitals/Pain Pain Assessment Pain Assessment: Faces Faces Pain Scale: Hurts even more Pain Location: R shoulder Pain Descriptors / Indicators: Grimacing, Sharp Pain Intervention(s): Limited activity within  patient's tolerance     Hand Dominance     Extremity/Trunk Assessment Upper Extremity Assessment Upper Extremity Assessment: RUE deficits/detail RUE Deficits / Details: impaired motor control and decreased sensation   Lower Extremity Assessment Lower Extremity Assessment: Overall WFL for tasks assessed   Cervical / Trunk Assessment Cervical / Trunk Assessment: Normal   Communication     Cognition Arousal/Alertness: Awake/alert Behavior During Therapy: WFL for tasks assessed/performed Overall Cognitive Status: Within Functional Limits for tasks assessed                                       General Comments        Exercises     Shoulder Instructions Shoulder Instructions Donning/doffing shirt without moving shoulder: Moderate assistance;Caregiver independent with task Method for sponge bathing under operated UE: Caregiver independent with task Donning/doffing sling/immobilizer: Caregiver independent with task Correct positioning of sling/immobilizer: Independent Pendulum exercises (written home exercise program): Independent ROM for elbow, wrist and digits of operated UE: Independent Sling wearing schedule (on at all times/off for ADL's): Independent Proper positioning of operated UE when showering: Independent Dressing change: Independent Positioning of UE while sleeping: [place] expects to be discharged to:: Private residence Living Arrangements: Spouse/significant other;Children Available Help at Discharge: Family                                    Prior Functioning/Environment                          OT Problem List: Decreased strength;Obesity;Decreased range of motion;Pain;Impaired UE functional use      OT Treatment/Interventions:      OT Goals(Current goals can be found in the care plan section) Acute Rehab OT Goals OT Goal Formulation: All assessment and education complete, DC therapy  OT Frequency:      Co-evaluation              AM-PAC OT "6 Clicks" Daily Activity     Outcome Measure Help from another person eating meals?: A Little Help from another person taking care of personal grooming?: None Help from another person toileting, which includes using toliet, bedpan, or urinal?: None Help from another person bathing (including washing, rinsing, drying)?: A Little Help from another person to put on and taking off regular upper body clothing?: A Lot Help from another person to put on and taking off regular lower body clothing?: A Little 6 Click Score: 19   End of Session Nurse Communication:  (OT education  complete)  Activity Tolerance: Patient tolerated treatment well Patient left: in chair;with family/visitor present  OT Visit Diagnosis: Pain Pain - Right/Left: Right Pain - part of body: Shoulder                Time: 1330-1340 OT Time Calculation (min): 10 min Charges:  OT General Charges $OT Visit: 1 Visit OT Evaluation $OT Eval Low Complexity: 1 Low  Gustavo Lah, OTR/L Acute Care Rehab Services  Office [phone]   Lenward Chancellor 08/16/2022, 2:03 PM

## 2022-08-17 ENCOUNTER — Encounter (HOSPITAL_COMMUNITY): Payer: Self-pay | Admitting: Orthopedic Surgery

## 2023-01-08 ENCOUNTER — Encounter: Payer: Self-pay | Admitting: *Deleted

## 2023-03-01 ENCOUNTER — Ambulatory Visit
Admission: EM | Admit: 2023-03-01 | Discharge: 2023-03-01 | Disposition: A | Discharge status: Home / Self Care | Payer: PRIVATE HEALTH INSURANCE | Attending: Family Medicine | Admitting: Family Medicine

## 2023-03-01 DIAGNOSIS — J3089 Other allergic rhinitis: Secondary | ICD-10-CM

## 2023-03-01 DIAGNOSIS — H6993 Unspecified Eustachian tube disorder, bilateral: Secondary | ICD-10-CM

## 2023-03-01 DIAGNOSIS — H66001 Acute suppurative otitis media without spontaneous rupture of ear drum, right ear: Secondary | ICD-10-CM | POA: Diagnosis not present

## 2023-03-01 MED ORDER — PREDNISONE 50 MG PO TABS: ORAL_TABLET | ORAL | 0 refills | Status: AC

## 2023-03-01 MED ORDER — AMOXICILLIN 875 MG PO TABS: 875.0000 mg | ORAL_TABLET | Freq: Two times a day (BID) | ORAL | 0 refills | Status: AC

## 2023-03-01 NOTE — Discharge Instructions (Signed)
Use Flonase twice daily, antihistamines daily, Coricidin HBP as needed and I have sent over an antibiotic and short burst of steroid.  Ibuprofen and Tylenol as needed additionally.

## 2023-03-01 NOTE — ED Provider Notes (Signed)
[facility]    CSN: [id number] Arrival date & time: 03/01/23  1215      History   Chief Complaint No chief complaint on file.   HPI Kenneth [name] is a 52 y.o. male.   Presenting today with 5-day history of sharp stabbing constant right ear pain, muffled hearing.  States he has been having sinus issues for the past 3 weeks or so and now this is progressively worsening.  Denies drainage, fever, chills, headaches, chest pain, shortness of breath.  Trying ibuprofen with minimal relief of symptoms.  Does have a history of seasonal allergies on as needed regimen for this.     Past Medical History:  Diagnosis Date   Aortic stenosis    Aortic stenosis    as a child   Arthritis    Hypertension     Patient Active Problem List   Diagnosis Date Noted   Cellulitis of left Posterior thigh 04/26/2017    Past Surgical History:  Procedure Laterality Date   AORTIC VALVE REPAIR  1978   at age 20. no problems since   CHOLECYSTECTOMY  2008   SHOULDER ARTHROSCOPY Left 2007   SHOULDER OPEN ROTATOR CUFF REPAIR Right 08/16/2022   Procedure: OPEN ROTATOR CUFF REPAIR;  Surgeon: Francena Hanly, MD;  Location: [facility];  Service: Orthopedics;  Laterality: Right;   SHOULDER SURGERY Right 2020   scope   TOTAL SHOULDER ARTHROPLASTY Right 10/26/2021   Procedure: TOTAL SHOULDER ARTHROPLASTY;  Surgeon: Francena Hanly, MD;  Location: [facility];  Service: Orthopedics;  Laterality: Right;    TOTAL SHOULDER REVISION Right 08/16/2022   Procedure: Right shoulder exploration anatomic arthroplasty and possible rotator cuff repair vs Conversion to Reverse arthroplasty;  Surgeon: Francena Hanly, MD;  Location: [facility];  Service: Orthopedics;  Laterality: Right;       Home Medications    Prior to Admission medications   Medication Sig Start Date End Date Taking? Authorizing Provider  amoxicillin (AMOXIL) 875 MG tablet Take 1 tablet (875 mg total) by mouth 2 (two) times daily. 03/01/23  Yes Particia Nearing, PA-C  predniSONE (DELTASONE) 50 MG tablet Take 1 tab daily with breakfast for 3 days 03/01/23  Yes Particia Nearing, PA-C  albuterol (VENTOLIN HFA) 108 (90 Base) MCG/ACT inhaler Inhale 2 puffs into the lungs every 6 (six) hours as needed for wheezing or shortness of breath. 08/03/22   Leath-Warren, Sadie Haber, NP  Aspirin-Acetaminophen-Caffeine (GOODYS EXTRA STRENGTH) [phone] MG PACK Take 1 packet by mouth daily.    [provider]  azelastine (ASTELIN) 0.1 % nasal spray Place 2 sprays into both nostrils 2 (two) times daily as needed for allergies. 06/14/22   [provider]  cetirizine (ZYRTEC) 10 MG tablet Take 1 tablet (10 mg total) by mouth daily. 08/03/22   Leath-Warren, Sadie Haber, NP  cyclobenzaprine (FLEXERIL) 10 MG tablet Take 1 tablet (10 mg total) by mouth 3 (three) times daily as needed for muscle spasms. 08/16/22   Shuford, French Ana, PA-C  diazepam (VALIUM) 10 MG tablet Take 5 mg by mouth at bedtime. 07/16/22   [provider]  doxycycline (VIBRA-TABS) 100 MG tablet Take 1 tablet (100 mg total) by mouth 2 (two) times daily. Patient not taking: Reported on 08/02/2022 01/26/22   Particia Nearing, PA-C  ibuprofen (ADVIL) 800 MG tablet Take 1 tablet (800 mg total) by mouth every 8 (eight) hours as needed. 08/16/22   Shuford, French Ana, PA-C  lisinopril (ZESTRIL) 10 MG tablet Take 10  mg by mouth daily. 07/16/22   [provider]  Multiple Vitamins-Minerals (AIRBORNE GUMMIES) CHEW Chew 2 each by mouth at bedtime.    [provider]  ondansetron (ZOFRAN) 4 MG tablet Take 1 tablet (4 mg total) by mouth every 8 (eight) hours as needed for nausea or vomiting. 08/16/22   Shuford, French Ana, PA-C  oxyCODONE-acetaminophen (PERCOCET) 5-325 MG tablet Take 1 tablet by mouth every 4 (four) hours as needed (max 6 q). 08/16/22   Shuford, French Ana, PA-C    Family History History reviewed. No pertinent family history.  Social History Social History    Tobacco Use   Smoking status: Former    Current packs/day: 0.00    Average packs/day: 1 pack/day for 22.0 years (22.0 ttl pk-yrs)    Types: Cigarettes    Start date: 76    Quit date: 2009    Years since quitting: 15.7   Smokeless tobacco: Current    Types: Chew  Vaping Use   Vaping status: Never Used  Substance Use Topics   Alcohol use: Yes    Comment: occas.   Drug use: No     Allergies   Patient has no known allergies.   Review of Systems Review of Systems Per HPI  Physical Exam Triage Vital Signs ED Triage Vitals  Encounter Vitals Group     BP 03/01/23 1227 121/76     Systolic BP Percentile --      Diastolic BP Percentile --      Pulse Rate 03/01/23 1227 69     Resp 03/01/23 1227 17     Temp 03/01/23 1227 98.4 F (36.9 C)     Temp Source 03/01/23 1227 Oral     SpO2 03/01/23 1227 96 %     Weight --      Height --      Head Circumference --      Peak Flow --      Pain Score 03/01/23 1229 6     Pain Loc --      Pain Education --      Exclude from Growth Chart --    No data found.  Updated Vital Signs BP 121/76 (BP Location: Right Arm)   Pulse 69   Temp 98.4 F (36.9 C) (Oral)   Resp 17   SpO2 96%   Visual Acuity Right Eye Distance:   Left Eye Distance:   Bilateral Distance:    Right Eye Near:   Left Eye Near:    Bilateral Near:     Physical Exam Vitals and nursing note reviewed.  Constitutional:      Appearance: He is well-developed.  HENT:     Head: Atraumatic.     Right Ear: External ear normal.     Left Ear: External ear normal.     Ears:     Comments: Right TM erythematous, edematous with purulent drainage behind TM    Nose: Congestion present.     Mouth/Throat:     Pharynx: No oropharyngeal exudate.  Eyes:     Conjunctiva/sclera: Conjunctivae normal.     Pupils: Pupils are equal, round, and reactive to light.  Cardiovascular:     Rate and Rhythm: Normal rate and regular rhythm.  Pulmonary:     Effort: Pulmonary effort  is normal. No respiratory distress.     Breath sounds: No wheezing or rales.  Musculoskeletal:        General: Normal range of motion.     Cervical back: Normal range  of motion and neck supple.  Lymphadenopathy:     Cervical: No cervical adenopathy.  Skin:    General: Skin is warm and dry.  Neurological:     Mental Status: He is alert and oriented to person, place, and time.  Psychiatric:        Behavior: Behavior normal.      UC Treatments / Results  Labs (all labs ordered are listed, but only abnormal results are displayed) Labs Reviewed - No data to display  EKG   Radiology No results found.  Procedures Procedures (including critical care time)  Medications Ordered in UC Medications - No data to display  Initial Impression / Assessment and Plan / UC Course  I have reviewed the triage vital signs and the nursing notes.  Pertinent labs & imaging results that were available during my care of the patient were reviewed by me and considered in my medical decision making (see chart for details).     Treat with short prednisone burst for eustachian tube dysfunction and sinus congestion from seasonal allergies, Amoxil for ear infection and discussed Flonase, over-the-counter decongestants, supportive home care.  Return for worsening symptoms.  Final Clinical Impressions(s) / UC Diagnoses   Final diagnoses:  Acute suppurative otitis media of right ear without spontaneous rupture of tympanic membrane, recurrence not specified  Eustachian tube dysfunction, bilateral  Seasonal allergic rhinitis due to other allergic trigger     Discharge Instructions      Use Flonase twice daily, antihistamines daily, Coricidin HBP as needed and I have sent over an antibiotic and short burst of steroid.  Ibuprofen and Tylenol as needed additionally.    ED Prescriptions     Medication Sig Dispense Auth. Provider   predniSONE (DELTASONE) 50 MG tablet Take 1 tab daily with breakfast  for 3 days 3 tablet Particia Nearing, PA-C   amoxicillin (AMOXIL) 875 MG tablet Take 1 tablet (875 mg total) by mouth 2 (two) times daily. 20 tablet Particia Nearing, [place]      PDMP not reviewed this encounter.   Particia Nearing, [place] 03/01/23 1339

## 2023-03-01 NOTE — ED Triage Notes (Signed)
Pt reports his right ear is painful x 5 days

## 2023-03-04 ENCOUNTER — Ambulatory Visit
Admission: EM | Admit: 2023-03-04 | Discharge: 2023-03-04 | Disposition: A | Discharge status: Home / Self Care | Payer: PRIVATE HEALTH INSURANCE | Attending: Family Medicine | Admitting: Family Medicine

## 2023-03-04 DIAGNOSIS — H66001 Acute suppurative otitis media without spontaneous rupture of ear drum, right ear: Secondary | ICD-10-CM | POA: Diagnosis not present

## 2023-03-04 MED ORDER — CLINDAMYCIN HCL 300 MG PO CAPS: 300.0000 mg | ORAL_CAPSULE | Freq: Two times a day (BID) | ORAL | 0 refills | Status: AC

## 2023-03-04 MED ORDER — PREDNISONE 20 MG PO TABS: 40.0000 mg | ORAL_TABLET | Freq: Every day | ORAL | 0 refills | Status: AC

## 2023-03-04 NOTE — ED Provider Notes (Signed)
[facility]    CSN: [id number] Arrival date & time: 03/04/23  1311      History   Chief Complaint No chief complaint on file.   HPI Kenneth [name] is a 52 y.o. male.   Patient presenting today with ongoing and worsening right ear pain, now with left ear pain as well since his visit 3 days ago where he was diagnosed with a right ear infection and given Amoxil and prednisone.  States the prednisone was helping slightly but did not feel the course was long enough, Amoxil not helping at all per patient.  States he always gets clindamycin when this happens and this works well for him.  Denies fever, chills, drainage from the ear, headache, nausea, vomiting.    Past Medical History:  Diagnosis Date   Aortic stenosis    Aortic stenosis    as a child   Arthritis    Hypertension     Patient Active Problem List   Diagnosis Date Noted   Cellulitis of left Posterior thigh 04/26/2017    Past Surgical History:  Procedure Laterality Date   AORTIC VALVE REPAIR  1978   at age 52. no problems since   CHOLECYSTECTOMY  2008   SHOULDER ARTHROSCOPY Left 2007   SHOULDER OPEN ROTATOR CUFF REPAIR Right 08/16/2022   Procedure: OPEN ROTATOR CUFF REPAIR;  Surgeon: Francena Hanly, MD;  Location: [facility];  Service: Orthopedics;  Laterality: Right;   SHOULDER SURGERY Right 2020   scope   TOTAL SHOULDER ARTHROPLASTY Right 10/26/2021   Procedure: TOTAL SHOULDER ARTHROPLASTY;  Surgeon: Francena Hanly, MD;  Location: [facility];  Service: Orthopedics;  Laterality: Right;    TOTAL SHOULDER REVISION Right 08/16/2022   Procedure: Right shoulder exploration anatomic arthroplasty and possible rotator cuff repair vs Conversion to Reverse arthroplasty;  Surgeon: Francena Hanly, MD;  Location: [facility];  Service: Orthopedics;  Laterality: Right;       Home Medications    Prior to Admission medications   Medication Sig Start Date End Date Taking? Authorizing Provider  clindamycin (CLEOCIN) 300 MG  capsule Take 1 capsule (300 mg total) by mouth 2 (two) times daily. 03/04/23  Yes Particia Nearing, PA-C  predniSONE (DELTASONE) 20 MG tablet Take 2 tablets (40 mg total) by mouth daily with breakfast. 03/04/23  Yes Particia Nearing, PA-C  albuterol (VENTOLIN HFA) 108 (90 Base) MCG/ACT inhaler Inhale 2 puffs into the lungs every 6 (six) hours as needed for wheezing or shortness of breath. 08/03/22   Leath-Warren, Sadie Haber, NP  amoxicillin (AMOXIL) 875 MG tablet Take 1 tablet (875 mg total) by mouth 2 (two) times daily. 03/01/23   Particia Nearing, PA-C  Aspirin-Acetaminophen-Caffeine (GOODYS EXTRA STRENGTH) [phone] MG PACK Take 1 packet by mouth daily.    [provider]  azelastine (ASTELIN) 0.1 % nasal spray Place 2 sprays into both nostrils 2 (two) times daily as needed for allergies. 06/14/22   [provider]  cetirizine (ZYRTEC) 10 MG tablet Take 1 tablet (10 mg total) by mouth daily. 08/03/22   Leath-Warren, Sadie Haber, NP  cyclobenzaprine (FLEXERIL) 10 MG tablet Take 1 tablet (10 mg total) by mouth 3 (three) times daily as needed for muscle spasms. 08/16/22   Shuford, French Ana, PA-C  diazepam (VALIUM) 10 MG tablet Take 5 mg by mouth at bedtime. 07/16/22   [provider]  doxycycline (VIBRA-TABS) 100 MG tablet Take 1 tablet (100 mg total) by mouth 2 (two) times daily. Patient not taking:  Reported on 08/02/2022 01/26/22   Particia Nearing, PA-C  ibuprofen (ADVIL) 800 MG tablet Take 1 tablet (800 mg total) by mouth every 8 (eight) hours as needed. 08/16/22   Shuford, French Ana, PA-C  lisinopril (ZESTRIL) 10 MG tablet Take 10 mg by mouth daily. 07/16/22   [provider]  Multiple Vitamins-Minerals (AIRBORNE GUMMIES) CHEW Chew 2 each by mouth at bedtime.    [provider]  ondansetron (ZOFRAN) 4 MG tablet Take 1 tablet (4 mg total) by mouth every 8 (eight) hours as needed for nausea or vomiting. 08/16/22   Shuford, French Ana, PA-C   oxyCODONE-acetaminophen (PERCOCET) 5-325 MG tablet Take 1 tablet by mouth every 4 (four) hours as needed (max 6 q). 08/16/22   Shuford, French Ana, PA-C  predniSONE (DELTASONE) 50 MG tablet Take 1 tab daily with breakfast for 3 days 03/01/23   Particia Nearing, PA-C    Family History History reviewed. No pertinent family history.  Social History Social History   Tobacco Use   Smoking status: Former    Current packs/day: 0.00    Average packs/day: 1 pack/day for 22.0 years (22.0 ttl pk-yrs)    Types: Cigarettes    Start date: 58    Quit date: 2009    Years since quitting: 15.7   Smokeless tobacco: Current    Types: Chew  Vaping Use   Vaping status: Never Used  Substance Use Topics   Alcohol use: Yes    Comment: occas.   Drug use: No     Allergies   Patient has no known allergies.   Review of Systems Review of Systems PER HPI  Physical Exam Triage Vital Signs ED Triage Vitals  Encounter Vitals Group     BP 03/04/23 1340 116/82     Systolic BP Percentile --      Diastolic BP Percentile --      Pulse Rate 03/04/23 1340 80     Resp 03/04/23 1340 15     Temp 03/04/23 1340 98.8 F (37.1 C)     Temp Source 03/04/23 1340 Oral     SpO2 03/04/23 1340 95 %     Weight --      Height --      Head Circumference --      Peak Flow --      Pain Score 03/04/23 1344 10     Pain Loc --      Pain Education --      Exclude from Growth Chart --    No data found.  Updated Vital Signs BP 116/82 (BP Location: Right Arm)   Pulse 80   Temp 98.8 F (37.1 C) (Oral)   Resp 15   SpO2 95%   Visual Acuity Right Eye Distance:   Left Eye Distance:   Bilateral Distance:    Right Eye Near:   Left Eye Near:    Bilateral Near:     Physical Exam Vitals and nursing note reviewed.  Constitutional:      Appearance: Normal appearance.  HENT:     Head: Atraumatic.     Ears:     Comments: Mild middle ear effusion left, right TM erythematous, edematous with purulent fluid  behind TM    Nose: Nose normal.     Mouth/Throat:     Mouth: Mucous membranes are moist.     Pharynx: Oropharynx is clear.  Eyes:     Extraocular Movements: Extraocular movements intact.     Conjunctiva/sclera: Conjunctivae normal.  Cardiovascular:  Rate and Rhythm: Normal rate and regular rhythm.  Pulmonary:     Effort: Pulmonary effort is normal.     Breath sounds: Normal breath sounds.  Musculoskeletal:        General: Normal range of motion.     Cervical back: Normal range of motion and neck supple.  Skin:    General: Skin is warm and dry.  Neurological:     General: No focal deficit present.     Mental Status: He is oriented to person, place, and time.  Psychiatric:        Mood and Affect: Mood normal.        Thought Content: Thought content normal.        Judgment: Judgment normal.      UC Treatments / Results  Labs (all labs ordered are listed, but only abnormal results are displayed) Labs Reviewed - No data to display  EKG   Radiology No results found.  Procedures Procedures (including critical care time)  Medications Ordered in UC Medications - No data to display  Initial Impression / Assessment and Plan / UC Course  I have reviewed the triage vital signs and the nursing notes.  Pertinent labs & imaging results that were available during my care of the patient were reviewed by me and considered in my medical decision making (see chart for details).     Given lack of improvement on Amoxil, switch to clindamycin which he states tends to work for him for his sinus and ear issues.  He is also requesting a longer course of the prednisone as it did help but did not feel long enough.  5-day course sent.  Discussed continued Flonase, Coricidin HBP, ENT follow-up if not resolving. Final Clinical Impressions(s) / UC Diagnoses   Final diagnoses:  Acute suppurative otitis media of right ear without spontaneous rupture of tympanic membrane, recurrence not  specified   Discharge Instructions   None    ED Prescriptions     Medication Sig Dispense Auth. Provider   clindamycin (CLEOCIN) 300 MG capsule Take 1 capsule (300 mg total) by mouth 2 (two) times daily. 14 capsule Particia Nearing, [place]   predniSONE (DELTASONE) 20 MG tablet Take 2 tablets (40 mg total) by mouth daily with breakfast. 10 tablet Particia Nearing, [place]      PDMP not reviewed this encounter.   Particia Nearing, [place] 03/04/23 1500

## 2023-03-04 NOTE — ED Triage Notes (Signed)
Pt c/o ear pain that is worsening, pt was seen on 03/01/2023, with ear pain it has not gotten any better with amoxicillin not the pain is bilateral, pt states he can barely hear out the right ear.

## 2023-07-14 ENCOUNTER — Encounter (HOSPITAL_COMMUNITY): Payer: Self-pay | Admitting: Emergency Medicine

## 2023-07-14 ENCOUNTER — Other Ambulatory Visit: Payer: Self-pay

## 2023-07-14 ENCOUNTER — Emergency Department (HOSPITAL_COMMUNITY)
Admission: EM | Admit: 2023-07-14 | Discharge: 2023-07-14 | Disposition: A | Discharge status: Home / Self Care | Payer: PRIVATE HEALTH INSURANCE | Attending: Emergency Medicine | Admitting: Emergency Medicine

## 2023-07-14 DIAGNOSIS — Z7982 Long term (current) use of aspirin: Secondary | ICD-10-CM | POA: Diagnosis not present

## 2023-07-14 DIAGNOSIS — H9312 Tinnitus, left ear: Secondary | ICD-10-CM | POA: Diagnosis present

## 2023-07-14 DIAGNOSIS — Z79899 Other long term (current) drug therapy: Secondary | ICD-10-CM | POA: Diagnosis not present

## 2023-07-14 DIAGNOSIS — I10 Essential (primary) hypertension: Secondary | ICD-10-CM | POA: Diagnosis not present

## 2023-07-14 NOTE — ED Provider Notes (Signed)
 [place] EMERGENCY DEPARTMENT AT [facility] Provider Note   CSN: [id number] Arrival date & time: 07/14/23  1139     History  No chief complaint on file.   Kenneth [name] is a 53 y.o. male.  The history is provided by the patient and medical records. No language interpreter was used.     53 year old male history of aorta stenosis, hypertension presenting with complaint of ear discomfort.  Patient noticed a constant ringing sound in his left ear that started 2 days ago.  There are no associated hearing loss, or ear pain.  He denies any injury to the ear.  He did mention having some cold symptoms a week and a half ago followed by stomach bug but that has since resolved.  He denies being exposed to loud sound or having any recent medication changes.  There is no headache neck pain chest pain or trouble breathing.  No drainage or pain when he lays on the affected side.  Ringing sound only affected his left ear.  Home Medications Prior to Admission medications   Medication Sig Start Date End Date Taking? Authorizing Provider  albuterol  (VENTOLIN  HFA) 108 (90 Base) MCG/ACT inhaler Inhale 2 puffs into the lungs every 6 (six) hours as needed for wheezing or shortness of breath. 08/03/22   Leath-Warren, Etta PARAS, NP  amoxicillin  (AMOXIL ) 875 MG tablet Take 1 tablet (875 mg total) by mouth 2 (two) times daily. 03/01/23   Stuart Vernell Norris, PA-C  Aspirin-Acetaminophen -Caffeine (GOODYS EXTRA STRENGTH) 500-325-65 MG PACK Take 1 packet by mouth daily.    [provider]  azelastine (ASTELIN) 0.1 % nasal spray Place 2 sprays into both nostrils 2 (two) times daily as needed for allergies. 06/14/22   [provider]  cetirizine  (ZYRTEC ) 10 MG tablet Take 1 tablet (10 mg total) by mouth daily. 08/03/22   Leath-Warren, Etta PARAS, NP  clindamycin  (CLEOCIN ) 300 MG capsule Take 1 capsule (300 mg total) by mouth 2 (two) times daily. 03/04/23   Stuart Vernell Norris, PA-C   cyclobenzaprine  (FLEXERIL ) 10 MG tablet Take 1 tablet (10 mg total) by mouth 3 (three) times daily as needed for muscle spasms. 08/16/22   Shuford, Randine, PA-C  diazepam (VALIUM) 10 MG tablet Take 5 mg by mouth at bedtime. 07/16/22   [provider]  doxycycline  (VIBRA -TABS) 100 MG tablet Take 1 tablet (100 mg total) by mouth 2 (two) times daily. Patient not taking: Reported on 08/02/2022 01/26/22   Stuart Vernell Norris, PA-C  ibuprofen  (ADVIL ) 800 MG tablet Take 1 tablet (800 mg total) by mouth every 8 (eight) hours as needed. 08/16/22   Shuford, Randine, PA-C  lisinopril (ZESTRIL) 10 MG tablet Take 10 mg by mouth daily. 07/16/22   [provider]  Multiple Vitamins-Minerals (AIRBORNE GUMMIES) CHEW Chew 2 each by mouth at bedtime.    [provider]  ondansetron  (ZOFRAN ) 4 MG tablet Take 1 tablet (4 mg total) by mouth every 8 (eight) hours as needed for nausea or vomiting. 08/16/22   Shuford, Randine, PA-C  oxyCODONE -acetaminophen  (PERCOCET) 5-325 MG tablet Take 1 tablet by mouth every 4 (four) hours as needed (max 6 q). 08/16/22   Shuford, Randine, PA-C  predniSONE  (DELTASONE ) 20 MG tablet Take 2 tablets (40 mg total) by mouth daily with breakfast. 03/04/23   Stuart Vernell Norris, PA-C  predniSONE  (DELTASONE ) 50 MG tablet Take 1 tab daily with breakfast for 3 days 03/01/23   Stuart Vernell Norris, PA-C      Allergies  Patient has no known allergies.    Review of Systems   Review of Systems  Constitutional:  Negative for fever.  HENT:  Negative for ear discharge, ear pain and hearing loss.     Physical Exam Updated Vital Signs BP (!) 147/96 (BP Location: Right Arm)   Pulse 76   Temp 98.8 F (37.1 C) (Oral)   Resp 14   Ht 5' 7 (1.702 m)   Wt 118 kg   SpO2 100%   BMI 40.74 kg/m  Physical Exam Vitals and nursing note reviewed.  Constitutional:      General: He is not in acute distress.    Appearance: He is well-developed.  HENT:     Head: Atraumatic.      Right Ear: Tympanic membrane, ear canal and external ear normal. There is no impacted cerumen.     Left Ear: Tympanic membrane, ear canal and external ear normal. There is no impacted cerumen.     Nose: Nose normal.  Eyes:     Conjunctiva/sclera: Conjunctivae normal.  Musculoskeletal:     Cervical back: Neck supple.  Skin:    Findings: No rash.  Neurological:     Mental Status: He is alert.     ED Results / Procedures / Treatments   Labs (all labs ordered are listed, but only abnormal results are displayed) Labs Reviewed - No data to display  EKG None  Radiology No results found.  Procedures Procedures    Medications Ordered in ED Medications - No data to display  ED Course/ Medical Decision Making/ A&P                                 Medical Decision Making  BP (!) 147/96 (BP Location: Right Arm)   Pulse 76   Temp 98.8 F (37.1 C) (Oral)   Resp 14   Ht 5' 7 (1.702 m)   Wt 118 kg   SpO2 100%   BMI 40.74 kg/m   26:69 PM  53 year old male history of aorta stenosis, hypertension presenting with complaint of ear discomfort.  Patient noticed a constant ringing sound in his left ear that started 2 days ago.  There are no associated hearing loss, or ear pain.  He denies any injury to the ear.  He did mention having some cold symptoms a week and a half ago followed by stomach bug but that has since resolved.  He denies being exposed to loud sound or having any recent medication changes.  There is no headache neck pain chest pain or trouble breathing.  No drainage or pain when he lays on the affected side.  Ringing sound only affected his left ear. No dizziness  On exam, patient has normal TM bilaterally, no evidence of cerumen impaction no signs of infection.  He was able to detect sounds equally in both ears.  You have a no evidence of infection and no signs of trauma, will have patient follow-up with ENT specialist for outpatient evaluation.          Final  Clinical Impression(s) / ED Diagnoses Final diagnoses:  Tinnitus of left ear    Rx / DC Orders ED Discharge Orders     None         Nivia Colon, PA-C 07/14/23 1317    Towana Ozell BROCKS, MD 07/14/23 [phone]

## 2023-07-14 NOTE — ED Triage Notes (Signed)
 Pt reports ringing and buzzing to left ear since Friday. Denies pain or drainage. Does reports stomach bug Tuesday and feeling better today.

## 2023-07-14 NOTE — Discharge Instructions (Signed)
 Please follow-up closely with ENT specialist for further evaluation and managements of the ringing in your left ear

## 2023-07-16 ENCOUNTER — Other Ambulatory Visit (HOSPITAL_COMMUNITY): Payer: Self-pay | Admitting: Physician Assistant

## 2023-07-16 DIAGNOSIS — H9312 Tinnitus, left ear: Secondary | ICD-10-CM

## 2023-07-16 DIAGNOSIS — H903 Sensorineural hearing loss, bilateral: Secondary | ICD-10-CM

## 2023-07-19 ENCOUNTER — Ambulatory Visit (HOSPITAL_COMMUNITY)
Admission: RE | Admit: 2023-07-19 | Discharge: 2023-07-19 | Disposition: A | Discharge status: Home / Self Care | Payer: PRIVATE HEALTH INSURANCE | Source: Ambulatory Visit | Attending: Physician Assistant | Admitting: Physician Assistant

## 2023-07-19 DIAGNOSIS — H903 Sensorineural hearing loss, bilateral: Secondary | ICD-10-CM | POA: Insufficient documentation

## 2023-07-19 DIAGNOSIS — H9312 Tinnitus, left ear: Secondary | ICD-10-CM | POA: Insufficient documentation

## 2023-07-19 MED ORDER — GADOBUTROL 1 MMOL/ML IV SOLN
10.0000 mL | Freq: Once | INTRAVENOUS | Status: AC | PRN
  Administered 2023-07-19: 10 mL via INTRAVENOUS

## 2023-12-02 IMAGING — DX DG FOOT COMPLETE 3+V*L*
3 series · 3 of 3 positions shown · non-contrast
Comparison: None.

CLINICAL DATA: Dropped ladder on foot 1 week ago with persistent
pain, initial encounter

EXAM:
LEFT FOOT - COMPLETE 3+ VIEW

[foot ap]
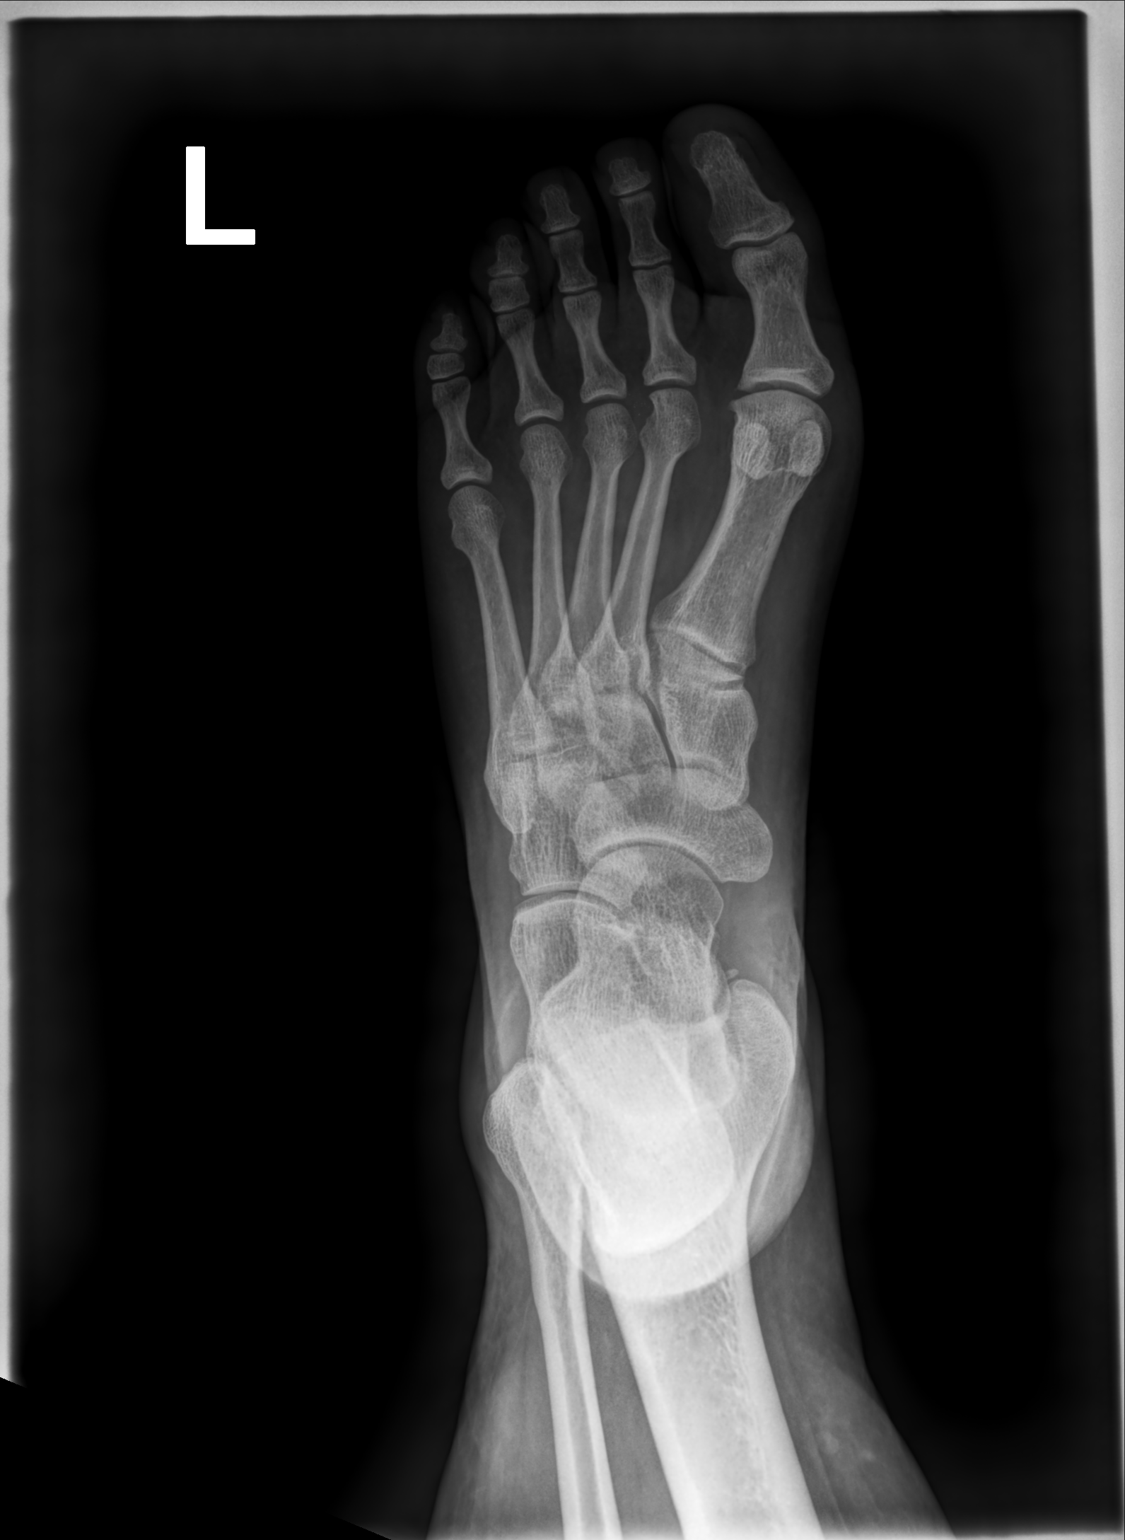

[foot mlo]
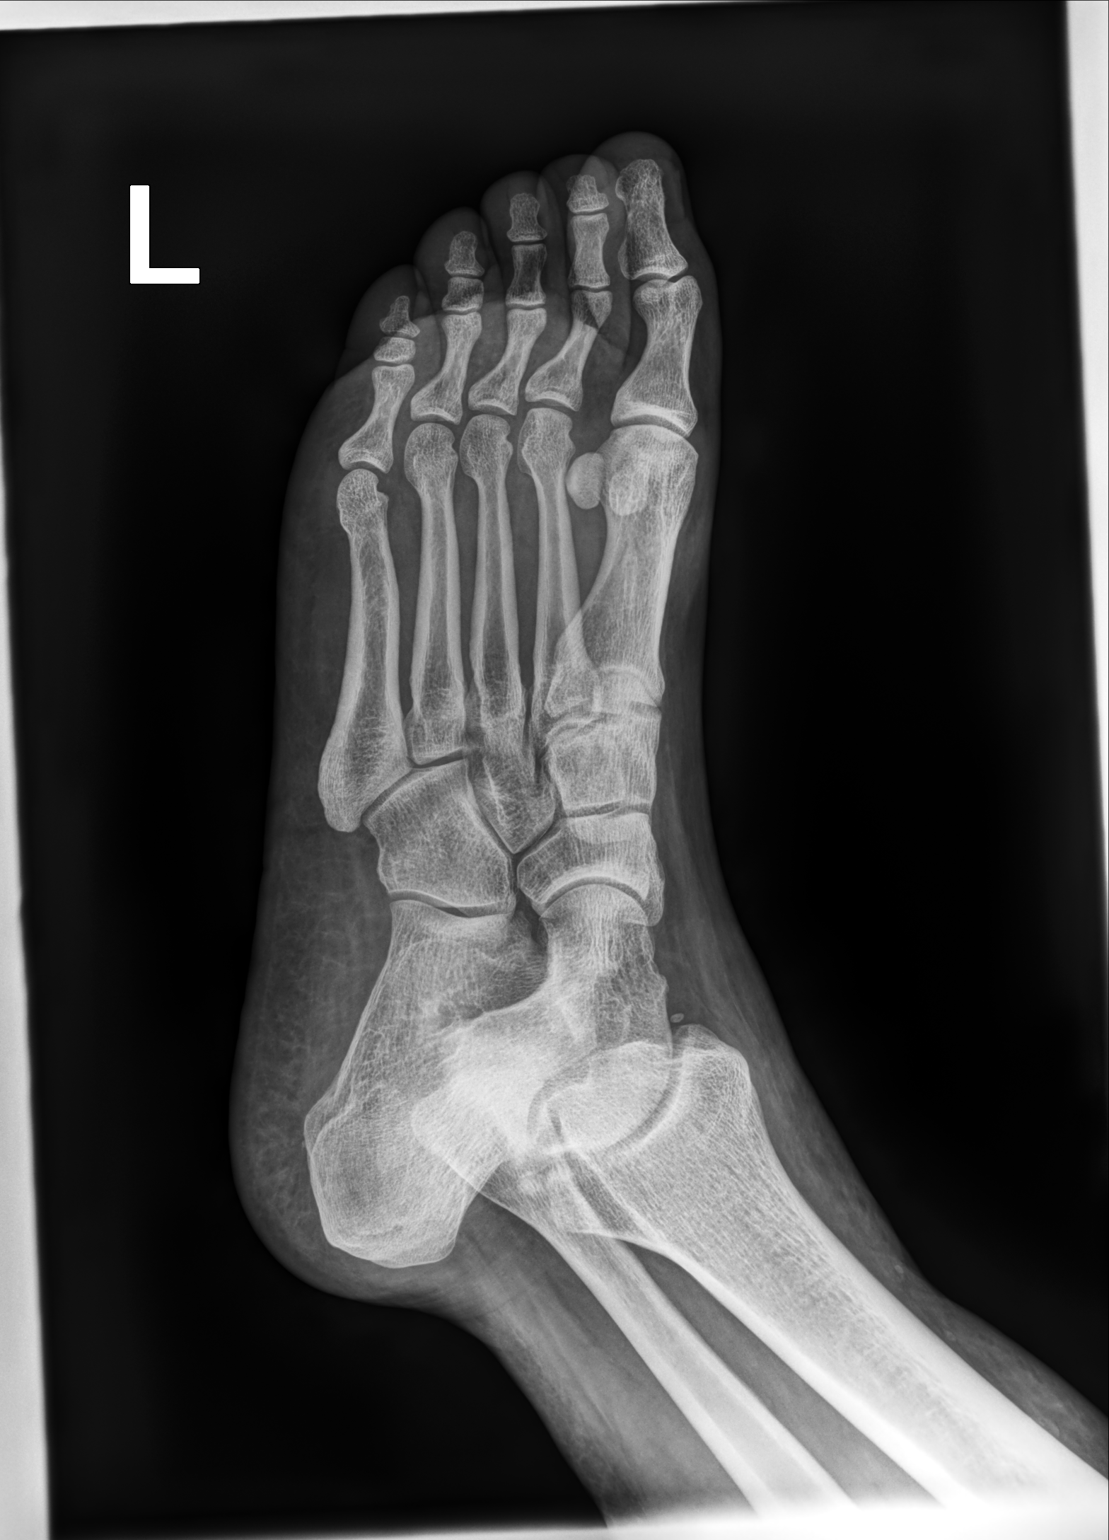

[foot lat]
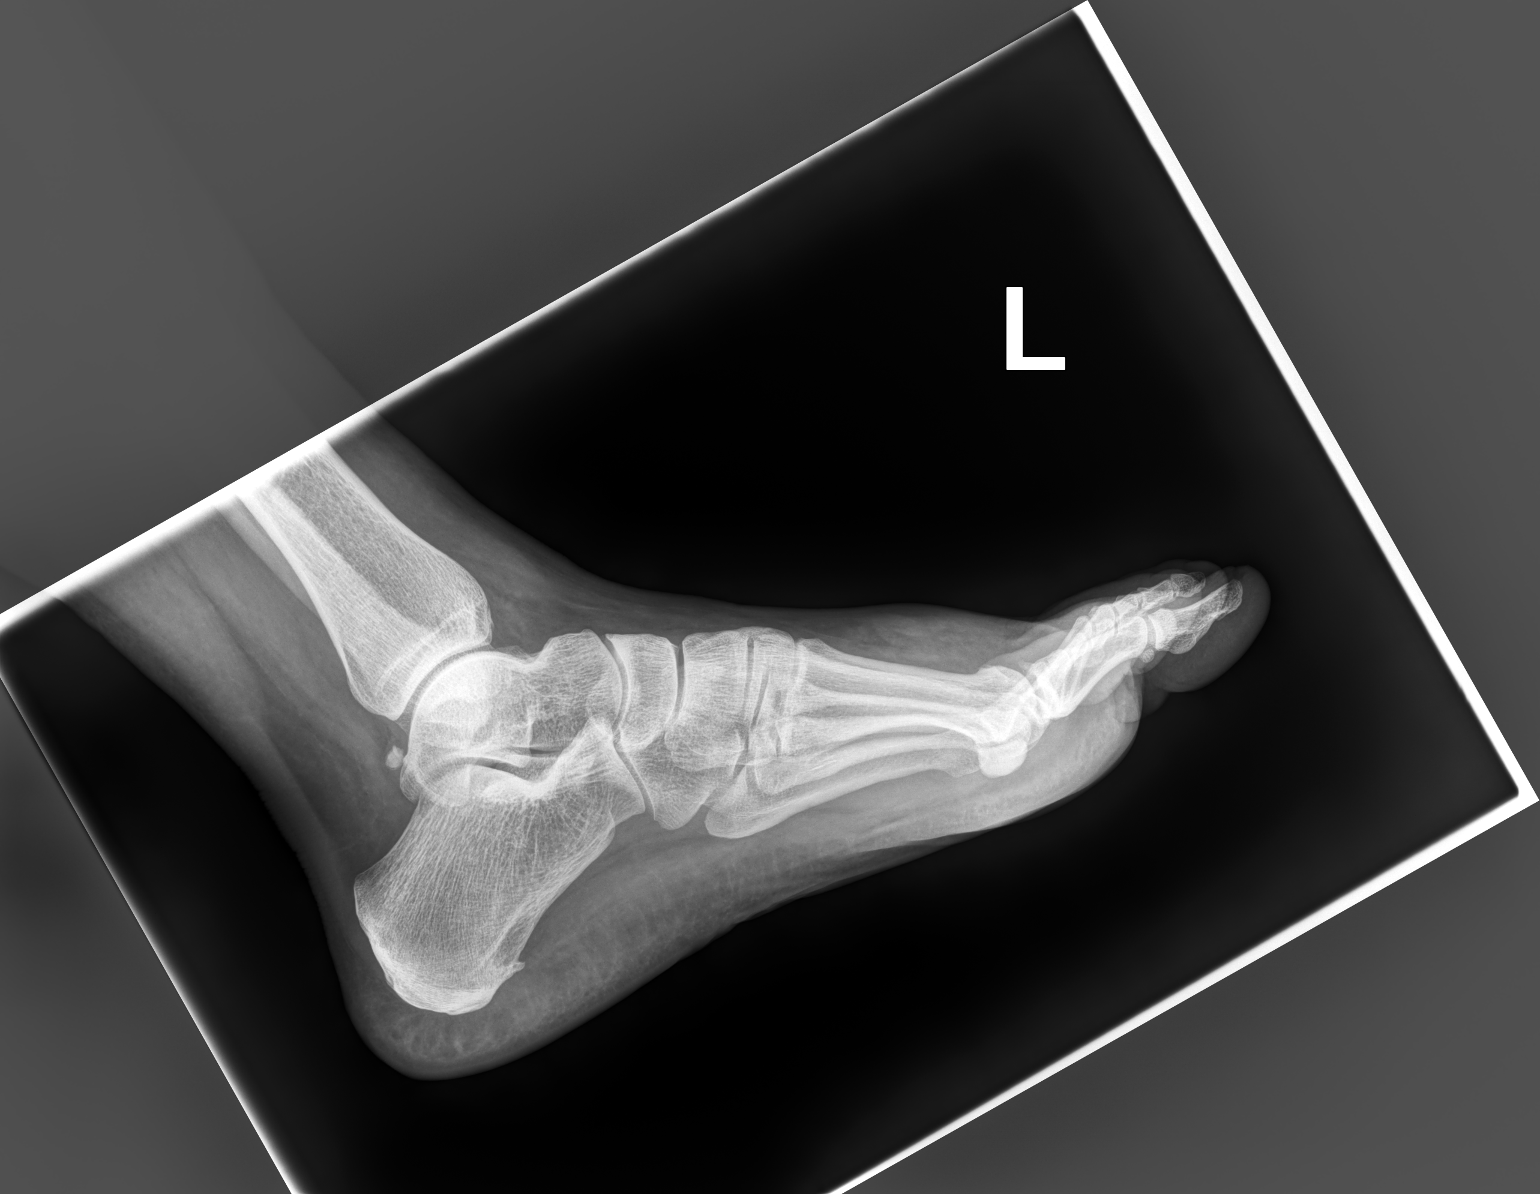

[3 of 3 positions shown; findings below may reference images not displayed]

FINDINGS: Soft tissue swelling is noted along the metatarsal heads dorsally.
No acute fracture or dislocation is seen. No other focal abnormality
is noted.
IMPRESSION: Soft tissue swelling without acute bony abnormality.

## 2024-05-04 ENCOUNTER — Encounter (HOSPITAL_COMMUNITY): Payer: Self-pay

## 2024-05-04 ENCOUNTER — Emergency Department (HOSPITAL_COMMUNITY)
Admission: EM | Admit: 2024-05-04 | Discharge: 2024-05-04 | Disposition: A | Discharge status: Home / Self Care | Payer: Worker's Compensation

## 2024-05-04 ENCOUNTER — Other Ambulatory Visit: Payer: Self-pay

## 2024-05-04 ENCOUNTER — Emergency Department (HOSPITAL_COMMUNITY): Payer: PRIVATE HEALTH INSURANCE

## 2024-05-04 DIAGNOSIS — M545 Low back pain, unspecified: Secondary | ICD-10-CM | POA: Insufficient documentation

## 2024-05-04 DIAGNOSIS — M25511 Pain in right shoulder: Secondary | ICD-10-CM | POA: Diagnosis not present

## 2024-05-04 DIAGNOSIS — Z7982 Long term (current) use of aspirin: Secondary | ICD-10-CM | POA: Diagnosis not present

## 2024-05-04 DIAGNOSIS — S161XXA Strain of muscle, fascia and tendon at neck level, initial encounter: Secondary | ICD-10-CM

## 2024-05-04 DIAGNOSIS — M542 Cervicalgia: Secondary | ICD-10-CM | POA: Insufficient documentation

## 2024-05-04 MED ORDER — KETOROLAC TROMETHAMINE 15 MG/ML IJ SOLN
15.0000 mg | Freq: Once | INTRAMUSCULAR | Status: AC
  Administered 2024-05-04: 15 mg via INTRAMUSCULAR
  Filled 2024-05-04: qty 1

## 2024-05-04 MED ORDER — DIAZEPAM 5 MG/ML IJ SOLN
5.0000 mg | Freq: Once | INTRAMUSCULAR | Status: DC
  Filled 2024-05-04: qty 2

## 2024-05-04 MED ORDER — NAPROXEN 500 MG PO TABS: 500.0000 mg | ORAL_TABLET | Freq: Two times a day (BID) | ORAL | 0 refills | Status: AC

## 2024-05-04 MED ORDER — METHOCARBAMOL 750 MG PO TABS: 750.0000 mg | ORAL_TABLET | Freq: Three times a day (TID) | ORAL | 0 refills | Status: AC

## 2024-05-04 MED ORDER — DIAZEPAM 5 MG/ML IJ SOLN
5.0000 mg | Freq: Once | INTRAMUSCULAR | Status: AC
  Administered 2024-05-04: 5 mg via INTRAMUSCULAR
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
## 2024-05-04 NOTE — ED Notes (Signed)
 Pt stated he did NOT need anything from [employer] for a workmans comp claim, including no need for drug or alcohol testing.

## 2024-05-04 NOTE — Discharge Instructions (Signed)
 Please follow-up closely with your primary care doctor on an outpatient basis.  Return to emergency department immediately for any new or worsening symptoms.
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
